# Patient Record
Sex: Female | Born: 1955 | Race: Black or African American | Hispanic: No | Marital: Single | Smoking: Never smoker
Health system: Southern US, Community
[De-identification: ages and names within clinical notes are randomized; demographics above are authoritative.]

## PROBLEM LIST (undated history)

## (undated) DIAGNOSIS — F32A Depression, unspecified: Secondary | ICD-10-CM

## (undated) DIAGNOSIS — F329 Major depressive disorder, single episode, unspecified: Secondary | ICD-10-CM

## (undated) DIAGNOSIS — G4730 Sleep apnea, unspecified: Secondary | ICD-10-CM

## (undated) DIAGNOSIS — E785 Hyperlipidemia, unspecified: Secondary | ICD-10-CM

## (undated) DIAGNOSIS — B20 Human immunodeficiency virus [HIV] disease: Secondary | ICD-10-CM

## (undated) DIAGNOSIS — A6000 Herpesviral infection of urogenital system, unspecified: Secondary | ICD-10-CM

## (undated) DIAGNOSIS — J309 Allergic rhinitis, unspecified: Secondary | ICD-10-CM

## (undated) DIAGNOSIS — N3090 Cystitis, unspecified without hematuria: Secondary | ICD-10-CM

## (undated) DIAGNOSIS — Z21 Asymptomatic human immunodeficiency virus [HIV] infection status: Secondary | ICD-10-CM

## (undated) HISTORY — PX: CARDIAC CATHETERIZATION: SHX172

---

## 2005-01-04 ENCOUNTER — Encounter (INDEPENDENT_AMBULATORY_CARE_PROVIDER_SITE_OTHER): Payer: Self-pay | Admitting: *Deleted

## 2005-04-06 ENCOUNTER — Ambulatory Visit: Payer: Self-pay | Admitting: Infectious Diseases

## 2005-04-06 ENCOUNTER — Encounter: Admission: RE | Admit: 2005-04-06 | Discharge: 2005-04-06 | Payer: Self-pay | Admitting: Infectious Diseases

## 2005-04-06 ENCOUNTER — Encounter (INDEPENDENT_AMBULATORY_CARE_PROVIDER_SITE_OTHER): Payer: Self-pay | Admitting: *Deleted

## 2005-04-26 ENCOUNTER — Ambulatory Visit: Payer: Self-pay | Admitting: Infectious Diseases

## 2005-08-30 ENCOUNTER — Ambulatory Visit: Payer: Self-pay | Admitting: Infectious Diseases

## 2005-08-30 ENCOUNTER — Encounter: Admission: RE | Admit: 2005-08-30 | Discharge: 2005-08-30 | Payer: Self-pay | Admitting: Infectious Diseases

## 2005-08-30 ENCOUNTER — Encounter (INDEPENDENT_AMBULATORY_CARE_PROVIDER_SITE_OTHER): Payer: Self-pay | Admitting: *Deleted

## 2005-08-30 LAB — CONVERTED CEMR LAB: CD4 Count: 250 microliters

## 2005-09-20 ENCOUNTER — Ambulatory Visit: Payer: Self-pay | Admitting: Infectious Diseases

## 2005-11-06 ENCOUNTER — Emergency Department (HOSPITAL_COMMUNITY): Admission: EM | Admit: 2005-11-06 | Discharge: 2005-11-06 | Payer: Self-pay | Admitting: Emergency Medicine

## 2006-01-13 ENCOUNTER — Encounter: Admission: RE | Admit: 2006-01-13 | Discharge: 2006-01-13 | Payer: Self-pay | Admitting: Infectious Diseases

## 2006-01-13 ENCOUNTER — Ambulatory Visit: Payer: Self-pay | Admitting: Infectious Diseases

## 2006-01-13 ENCOUNTER — Encounter (INDEPENDENT_AMBULATORY_CARE_PROVIDER_SITE_OTHER): Payer: Self-pay | Admitting: *Deleted

## 2006-01-13 ENCOUNTER — Emergency Department (HOSPITAL_COMMUNITY): Admission: EM | Admit: 2006-01-13 | Discharge: 2006-01-13 | Payer: Self-pay | Admitting: Emergency Medicine

## 2006-01-13 LAB — CONVERTED CEMR LAB
AST: 16 units/L (ref 0–37)
Alkaline Phosphatase: 97 units/L (ref 39–117)
BUN: 8 mg/dL (ref 6–23)
Basophils Relative: 1 % (ref 0–1)
Creatinine, Ser: 0.83 mg/dL (ref 0.40–1.20)
Eosinophils Relative: 3 % (ref 0–4)
HCT: 37.9 % (ref 34.4–43.3)
Lymphs Abs: 1.7 10*3/uL (ref 0.8–3.1)
MCV: 90.9 fL (ref 78.8–100.0)
Monocytes Relative: 8 % (ref 3–11)
Platelets: 277 10*3/uL (ref 152–374)
RBC: 4.17 M/uL (ref 3.79–4.96)
WBC: 4 10*3/uL (ref 3.7–10.0)

## 2006-01-14 LAB — CONVERTED CEMR LAB: Pap Smear: NORMAL

## 2006-03-15 ENCOUNTER — Ambulatory Visit: Payer: Self-pay | Admitting: Internal Medicine

## 2006-03-22 DIAGNOSIS — B20 Human immunodeficiency virus [HIV] disease: Secondary | ICD-10-CM | POA: Insufficient documentation

## 2006-03-28 ENCOUNTER — Encounter (INDEPENDENT_AMBULATORY_CARE_PROVIDER_SITE_OTHER): Payer: Self-pay | Admitting: *Deleted

## 2006-03-28 LAB — CONVERTED CEMR LAB

## 2006-03-30 ENCOUNTER — Encounter (INDEPENDENT_AMBULATORY_CARE_PROVIDER_SITE_OTHER): Payer: Self-pay | Admitting: Infectious Diseases

## 2007-01-31 ENCOUNTER — Encounter (INDEPENDENT_AMBULATORY_CARE_PROVIDER_SITE_OTHER): Payer: Self-pay | Admitting: *Deleted

## 2007-03-23 ENCOUNTER — Telehealth (INDEPENDENT_AMBULATORY_CARE_PROVIDER_SITE_OTHER): Payer: Self-pay | Admitting: *Deleted

## 2007-09-27 ENCOUNTER — Encounter (INDEPENDENT_AMBULATORY_CARE_PROVIDER_SITE_OTHER): Payer: Self-pay | Admitting: Infectious Diseases

## 2007-12-15 ENCOUNTER — Encounter (INDEPENDENT_AMBULATORY_CARE_PROVIDER_SITE_OTHER): Payer: Self-pay | Admitting: Infectious Diseases

## 2015-01-31 ENCOUNTER — Encounter (HOSPITAL_COMMUNITY): Payer: Self-pay

## 2015-01-31 ENCOUNTER — Emergency Department (HOSPITAL_COMMUNITY)
Admission: EM | Admit: 2015-01-31 | Discharge: 2015-01-31 | Disposition: A | Discharge status: Home / Self Care | Payer: No Typology Code available for payment source | Attending: Emergency Medicine | Admitting: Emergency Medicine

## 2015-01-31 ENCOUNTER — Emergency Department (HOSPITAL_COMMUNITY): Payer: No Typology Code available for payment source

## 2015-01-31 DIAGNOSIS — Y998 Other external cause status: Secondary | ICD-10-CM | POA: Insufficient documentation

## 2015-01-31 DIAGNOSIS — S161XXA Strain of muscle, fascia and tendon at neck level, initial encounter: Secondary | ICD-10-CM | POA: Insufficient documentation

## 2015-01-31 DIAGNOSIS — Y9389 Activity, other specified: Secondary | ICD-10-CM | POA: Insufficient documentation

## 2015-01-31 DIAGNOSIS — S199XXA Unspecified injury of neck, initial encounter: Secondary | ICD-10-CM | POA: Diagnosis present

## 2015-01-31 DIAGNOSIS — S20211A Contusion of right front wall of thorax, initial encounter: Secondary | ICD-10-CM | POA: Diagnosis not present

## 2015-01-31 DIAGNOSIS — S0990XA Unspecified injury of head, initial encounter: Secondary | ICD-10-CM | POA: Diagnosis not present

## 2015-01-31 DIAGNOSIS — S301XXA Contusion of abdominal wall, initial encounter: Secondary | ICD-10-CM | POA: Insufficient documentation

## 2015-01-31 DIAGNOSIS — Y92410 Unspecified street and highway as the place of occurrence of the external cause: Secondary | ICD-10-CM | POA: Diagnosis not present

## 2015-01-31 DIAGNOSIS — B20 Human immunodeficiency virus [HIV] disease: Secondary | ICD-10-CM | POA: Diagnosis not present

## 2015-01-31 HISTORY — DX: Asymptomatic human immunodeficiency virus (hiv) infection status: Z21

## 2015-01-31 HISTORY — DX: Human immunodeficiency virus (HIV) disease: B20

## 2015-01-31 LAB — I-STAT CHEM 8, ED
BUN: 17 mg/dL (ref 6–20)
CHLORIDE: 104 mmol/L (ref 101–111)
CREATININE: 0.8 mg/dL (ref 0.44–1.00)
Calcium, Ion: 1.2 mmol/L (ref 1.12–1.23)
Glucose, Bld: 105 mg/dL — ABNORMAL HIGH (ref 65–99)
HEMATOCRIT: 43 % (ref 36.0–46.0)
Hemoglobin: 14.6 g/dL (ref 12.0–15.0)
Potassium: 3.8 mmol/L (ref 3.5–5.1)
SODIUM: 144 mmol/L (ref 135–145)
TCO2: 27 mmol/L (ref 0–100)

## 2015-01-31 MED ORDER — SODIUM CHLORIDE 0.9 % IV BOLUS (SEPSIS)
1000.0000 mL | Freq: Once | INTRAVENOUS | Status: AC
  Administered 2015-01-31: 1000 mL via INTRAVENOUS

## 2015-01-31 MED ORDER — HYDROCODONE-ACETAMINOPHEN 5-325 MG PO TABS: 1.0000 | ORAL_TABLET | Freq: Four times a day (QID) | ORAL | Status: DC | PRN

## 2015-01-31 NOTE — ED Notes (Signed)
Clarified with Dr. Judd Lienelo, no level 2 activation at this time, radiology to be ordered.
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
## 2015-01-31 NOTE — ED Notes (Signed)
Collar removed per Dr. Judd Lienelo.

## 2015-01-31 NOTE — ED Notes (Signed)
Dr. Delo at the bedside 

## 2015-01-31 NOTE — ED Notes (Signed)
Notified MD Delo that pt ambulated well.
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
## 2015-01-31 NOTE — ED Notes (Signed)
MD Delo notified of pt incontinence and bilateral numbness in legs.

## 2017-12-05 ENCOUNTER — Encounter: Payer: Self-pay | Admitting: *Deleted

## 2017-12-06 ENCOUNTER — Other Ambulatory Visit: Payer: Self-pay

## 2017-12-06 ENCOUNTER — Ambulatory Visit: Payer: BLUE CROSS/BLUE SHIELD | Admitting: Certified Registered Nurse Anesthetist

## 2017-12-06 ENCOUNTER — Ambulatory Visit
Admission: RE | Admit: 2017-12-06 | Discharge: 2017-12-06 | Disposition: A | Discharge status: Home / Self Care | Payer: BLUE CROSS/BLUE SHIELD | Source: Ambulatory Visit | Attending: Internal Medicine | Admitting: Internal Medicine

## 2017-12-06 ENCOUNTER — Encounter: Payer: Self-pay | Admitting: Certified Registered Nurse Anesthetist

## 2017-12-06 ENCOUNTER — Encounter
Admission: RE | Disposition: A | Discharge status: Home / Self Care | Payer: Self-pay | Source: Ambulatory Visit | Attending: Internal Medicine

## 2017-12-06 DIAGNOSIS — G4730 Sleep apnea, unspecified: Secondary | ICD-10-CM | POA: Insufficient documentation

## 2017-12-06 DIAGNOSIS — B20 Human immunodeficiency virus [HIV] disease: Secondary | ICD-10-CM | POA: Diagnosis not present

## 2017-12-06 DIAGNOSIS — Z1211 Encounter for screening for malignant neoplasm of colon: Secondary | ICD-10-CM | POA: Insufficient documentation

## 2017-12-06 DIAGNOSIS — K5730 Diverticulosis of large intestine without perforation or abscess without bleeding: Secondary | ICD-10-CM | POA: Diagnosis not present

## 2017-12-06 DIAGNOSIS — K640 First degree hemorrhoids: Secondary | ICD-10-CM | POA: Diagnosis not present

## 2017-12-06 DIAGNOSIS — F329 Major depressive disorder, single episode, unspecified: Secondary | ICD-10-CM | POA: Insufficient documentation

## 2017-12-06 DIAGNOSIS — Z800 Family history of malignant neoplasm of digestive organs: Secondary | ICD-10-CM | POA: Diagnosis not present

## 2017-12-06 HISTORY — DX: Cystitis, unspecified without hematuria: N30.90

## 2017-12-06 HISTORY — DX: Major depressive disorder, single episode, unspecified: F32.9

## 2017-12-06 HISTORY — DX: Depression, unspecified: F32.A

## 2017-12-06 HISTORY — DX: Allergic rhinitis, unspecified: J30.9

## 2017-12-06 HISTORY — PX: COLONOSCOPY WITH PROPOFOL: SHX5780

## 2017-12-06 HISTORY — DX: Sleep apnea, unspecified: G47.30

## 2017-12-06 HISTORY — DX: Herpesviral infection of urogenital system, unspecified: A60.00

## 2017-12-06 SURGERY — COLONOSCOPY WITH PROPOFOL
Anesthesia: General

## 2017-12-06 MED ORDER — MIDAZOLAM HCL 2 MG/2ML IJ SOLN
INTRAMUSCULAR | Status: AC
  Filled 2017-12-06: qty 2

## 2017-12-06 MED ORDER — LIDOCAINE HCL (CARDIAC) PF 100 MG/5ML IV SOSY
PREFILLED_SYRINGE | INTRAVENOUS | Status: DC | PRN
  Administered 2017-12-06: 60 mg via INTRAVENOUS

## 2017-12-06 MED ORDER — SODIUM CHLORIDE 0.9 % IV SOLN
INTRAVENOUS | Status: DC
  Administered 2017-12-06: 14:00:00 via INTRAVENOUS

## 2017-12-06 MED ORDER — PROPOFOL 500 MG/50ML IV EMUL
INTRAVENOUS | Status: DC | PRN
  Administered 2017-12-06: 100 ug/kg/min via INTRAVENOUS

## 2017-12-06 MED ORDER — PROPOFOL 10 MG/ML IV BOLUS
INTRAVENOUS | Status: DC | PRN
  Administered 2017-12-06: 80 mg via INTRAVENOUS

## 2017-12-06 MED ORDER — FENTANYL CITRATE (PF) 100 MCG/2ML IJ SOLN
INTRAMUSCULAR | Status: AC
  Filled 2017-12-06: qty 2

## 2017-12-06 MED ORDER — LIDOCAINE HCL (PF) 1 % IJ SOLN
INTRAMUSCULAR | Status: AC
  Administered 2017-12-06: 2 mL
  Filled 2017-12-06: qty 2

## 2017-12-06 MED ORDER — PROPOFOL 500 MG/50ML IV EMUL
INTRAVENOUS | Status: AC
  Filled 2017-12-06: qty 50

## 2017-12-06 MED ORDER — FENTANYL CITRATE (PF) 100 MCG/2ML IJ SOLN
INTRAMUSCULAR | Status: DC | PRN
  Administered 2017-12-06: 25 ug via INTRAVENOUS

## 2017-12-06 MED ORDER — LIDOCAINE HCL (PF) 2 % IJ SOLN
INTRAMUSCULAR | Status: AC
  Filled 2017-12-06: qty 10

## 2017-12-06 MED ORDER — MIDAZOLAM HCL 5 MG/5ML IJ SOLN
INTRAMUSCULAR | Status: DC | PRN
  Administered 2017-12-06: 2 mg via INTRAVENOUS

## 2017-12-06 NOTE — Anesthesia Preprocedure Evaluation (Addendum)
Anesthesia Evaluation  Patient identified by MRN, date of birth, ID band Patient awake    Reviewed: Allergy & Precautions, H&P , NPO status , Patient's Chart, lab work & pertinent test results  Airway Mallampati: III  TM Distance: >3 FB Neck ROM: full    Dental  (+) Teeth Intact   Pulmonary sleep apnea ,    breath sounds clear to auscultation       Cardiovascular negative cardio ROS   Rhythm:regular Rate:Normal     Neuro/Psych PSYCHIATRIC DISORDERS Depression negative neurological ROS     GI/Hepatic negative GI ROS, Neg liver ROS,   Endo/Other  negative endocrine ROS  Renal/GU negative Renal ROS  negative genitourinary   Musculoskeletal   Abdominal   Peds  Hematology negative hematology ROS (+)   Anesthesia Other Findings HIV positive status  Reproductive/Obstetrics negative OB ROS                            Anesthesia Physical Anesthesia Plan  ASA: II  Anesthesia Plan: General   Post-op Pain Management:    Induction:   PONV Risk Score and Plan: Propofol infusion and TIVA  Airway Management Planned: Natural Airway and Nasal Cannula  Additional Equipment:   Intra-op Plan:   Post-operative Plan:   Informed Consent: I have reviewed the patients History and Physical, chart, labs and discussed the procedure including the risks, benefits and alternatives for the proposed anesthesia with the patient or authorized representative who has indicated his/her understanding and acceptance.   Dental Advisory Given  Plan Discussed with: Anesthesiologist, CRNA and Surgeon  Anesthesia Plan Comments:         Anesthesia Quick Evaluation

## 2017-12-06 NOTE — Transfer of Care (Signed)
Immediate Anesthesia Transfer of Care Note  Patient: Gwendolyn Gray  Procedure(s) Performed: COLONOSCOPY WITH PROPOFOL (N/A )  Patient Location: Endoscopy Unit  Anesthesia Type:General  Level of Consciousness: drowsy and patient cooperative  Airway & Oxygen Therapy: Patient Spontanous Breathing  Post-op Assessment: Report given to RN and Post -op Vital signs reviewed and stable  Post vital signs: Reviewed and stable  Last Vitals:  Vitals Value Taken Time  BP 106/67 12/06/2017  3:18 PM  Temp    Pulse 83 12/06/2017  3:21 PM  Resp 16 12/06/2017  3:21 PM  SpO2 97 % 12/06/2017  3:21 PM  Vitals shown include unvalidated device data.  Last Pain:  Vitals:   12/06/17 1500  TempSrc: Tympanic  PainSc: Asleep         Complications: No apparent anesthesia complications

## 2017-12-06 NOTE — Op Note (Signed)
[facility] Patient Name: Gwendolyn Gray Procedure Date: 12/06/2017 2:55 PM MRN: [id number] Account #: [id number] Date of Birth: 1955/09/02 Admit Type: Outpatient Age: 62 Room: [facility] ENDO ROOM 2 Gender: Female Note Status: Finalized Procedure:            Colonoscopy Indications:          Screening in patient at increased risk: Family history                        of 1st-degree relative with colorectal cancer Providers:            Boykin Nearing. Norma Fredrickson MD, MD Referring MD:         Lynnda Shields. Artis Flock, PA (Referring MD) Medicines:            Propofol per Anesthesia Complications:        No immediate complications. Procedure:            Pre-Anesthesia Assessment:                       - The risks and benefits of the procedure and the                        sedation options and risks were discussed with the                        patient. All questions were answered and informed                        consent was obtained.                       - Patient identification and proposed procedure were                        verified prior to the procedure by the nurse. The                        procedure was verified in the procedure room.                       - ASA Grade Assessment: III - A patient with severe                        systemic disease.                       - After reviewing the risks and benefits, the patient                        was deemed in satisfactory condition to undergo the                        procedure.                       After obtaining informed consent, the colonoscope was                        passed under direct vision. Throughout the procedure,  the patient's blood pressure, pulse, and oxygen                        saturations were monitored continuously. The                        Colonoscope was introduced through the anus and                        advanced to the the cecum, identified by appendiceal                        orifice and ileocecal valve. The colonoscopy was                        performed without difficulty. The patient tolerated the                        procedure well. The quality of the bowel preparation                        was excellent. The ileocecal valve, appendiceal                        orifice, and rectum were photographed. Findings:      The perianal and digital rectal examinations were normal. Pertinent       negatives include normal sphincter tone and no palpable rectal lesions.      A few small-mouthed diverticula were found in the sigmoid colon.      Internal hemorrhoids were found during retroflexion. The hemorrhoids       were Grade I (internal hemorrhoids that do not prolapse).      The exam was otherwise without abnormality. Impression:           - Diverticulosis in the sigmoid colon.                       - Internal hemorrhoids.                       - The examination was otherwise normal.                       - No specimens collected. Recommendation:       - Patient has a contact number available for                        emergencies. The signs and symptoms of potential                        delayed complications were discussed with the patient.                        Return to normal activities tomorrow. Written discharge                        instructions were provided to the patient.                       - Resume previous diet.                       -  Continue present medications.                       - Repeat colonoscopy in 5 years for screening purposes.                       - Return to GI office PRN.                       - The findings and recommendations were discussed with                        the patient and their family. Procedure Code(s):    --- Professional ---                       R6045, Colorectal cancer screening; colonoscopy on                        individual at high risk Diagnosis Code(s):    --- Professional ---                        K57.30, Diverticulosis of large intestine without                        perforation or abscess without bleeding                       K64.0, First degree hemorrhoids                       Z80.0, Family history of malignant neoplasm of                        digestive organs CPT copyright 2018 American Medical Association. All rights reserved. The codes documented in this report are preliminary and upon coder review may  be revised to meet current compliance requirements. Stanton Kidney MD, MD 12/06/2017 3:16:19 PM This report has been signed electronically. Number of Addenda: 0 Note Initiated On: 12/06/2017 2:55 PM Scope Withdrawal Time: 0 hours 5 minutes 26 seconds  Total Procedure Duration: 0 hours 9 minutes 56 seconds       [facility]

## 2017-12-06 NOTE — Anesthesia Post-op Follow-up Note (Signed)
Anesthesia QCDR form completed.        

## 2017-12-06 NOTE — Interval H&P Note (Signed)
History and Physical Interval Note:  12/06/2017 11:41 AM  Gwendolyn Gray  has presented today for surgery, with the diagnosis of SCREENING FAM HX COLON CANCER  The various methods of treatment have been discussed with the patient and family. After consideration of risks, benefits and other options for treatment, the patient has consented to  Procedure(s): COLONOSCOPY WITH PROPOFOL (N/A) as a surgical intervention .  The patient's history has been reviewed, patient examined, no change in status, stable for surgery.  I have reviewed the patient's chart and labs.  Questions were answered to the patient's satisfaction.     [place], [place]

## 2017-12-06 NOTE — Interval H&P Note (Signed)
History and Physical Interval Note:  12/06/2017 2:52 PM  Gwendolyn Gray  has presented today for surgery, with the diagnosis of SCREENING FAM HX COLON CANCER  The various methods of treatment have been discussed with the patient and family. After consideration of risks, benefits and other options for treatment, the patient has consented to  Procedure(s): COLONOSCOPY WITH PROPOFOL (N/A) as a surgical intervention .  The patient's history has been reviewed, patient examined, no change in status, stable for surgery.  I have reviewed the patient's chart and labs.  Questions were answered to the patient's satisfaction.     [place], [place]

## 2017-12-06 NOTE — H&P (Signed)
Outpatient short stay form Pre-procedure 12/06/2017 11:39 AM Teodoro K. Norma Fredrickson, M.D.  Primary Physician: Yong Channel, M.D.  Reason for visit: Family history of colon cancer - Father  History of present illness:  62 y/o female presents for increased risk colon cancer screening due to her father having colon cancer. Patient denies change in bowel habits, rectal bleeding, weight loss or abdominal pain.    No current facility-administered medications for this encounter.   Medications Prior to Admission  Medication Sig Dispense Refill Last Dose  . acyclovir ointment (ZOVIRAX) 5 % Apply 1 application topically every 3 (three) hours.     . clobetasol cream (TEMOVATE) 0.05 % Apply 1 application topically 2 (two) times daily.     Marland Kitchen estradiol (ESTRACE) 0.5 MG tablet Take 0.5 mg by mouth daily.     . medroxyPROGESTERone (PROVERA) 2.5 MG tablet Take 2.5 mg by mouth daily.     . Multiple Vitamin (MULTIVITAMIN) tablet Take 1 tablet by mouth daily.     . valACYclovir (VALTREX) 500 MG tablet Take 500 mg by mouth 2 (two) times daily.     . vitamin B-12 (CYANOCOBALAMIN) 500 MCG tablet Take 500 mcg by mouth daily.     . Vitamin D, Ergocalciferol, (DRISDOL) 50000 units CAPS capsule Take 50,000 Units by mouth every 7 (seven) days.     . ATRIPLA 600-200-300 MG tablet Take 1 tablet by mouth daily.  11 01/30/2015 at Unknown time  . HYDROcodone-acetaminophen (NORCO) 5-325 MG tablet Take 1-2 tablets by mouth every 6 (six) hours as needed. 15 tablet 0      No Known Allergies   Past Medical History:  Diagnosis Date  . Allergic rhinitis   . Cystitis   . Depression   . Genital herpes   . HIV (human immunodeficiency virus infection) (HCC)   . HIV (human immunodeficiency virus infection) (HCC)     Review of systems:  Otherwise negative.    Physical Exam  Gen: Alert, oriented. Appears stated age.  HEENT: [place]/AT. PERRLA. Lungs: CTA, no wheezes. CV: RR nl S1, S2. Abd: soft, benign, no masses.  BS+ Ext: No edema. Pulses 2+    Planned procedures: Proceed with colonoscopy. The patient understands the nature of the planned procedure, indications, risks, alternatives and potential complications including but not limited to bleeding, infection, perforation, damage to internal organs and possible oversedation/side effects from anesthesia. The patient agrees and gives consent to proceed.  Please refer to procedure notes for findings, recommendations and patient disposition/instructions.     Teodoro K. Norma Fredrickson, M.D. Gastroenterology 12/06/2017  11:39 AM

## 2017-12-06 NOTE — Anesthesia Postprocedure Evaluation (Signed)
Anesthesia Post Note  Patient: Gwendolyn Gray  Procedure(s) Performed: COLONOSCOPY WITH PROPOFOL (N/A )  Patient location during evaluation: Endoscopy Anesthesia Type: General Level of consciousness: awake and alert Pain management: pain level controlled Vital Signs Assessment: post-procedure vital signs reviewed and stable Respiratory status: spontaneous breathing, nonlabored ventilation, respiratory function stable and patient connected to nasal cannula oxygen Cardiovascular status: blood pressure returned to baseline and stable Postop Assessment: no apparent nausea or vomiting Anesthetic complications: no     Last Vitals:  Vitals:   12/06/17 1527 12/06/17 1537  BP: 122/82 125/76  Pulse: 76 64  Resp: 14 14  Temp:    SpO2: 100% 100%    Last Pain:  Vitals:   12/06/17 1537  TempSrc:   PainSc: 0-No pain                 Meenakshi Sazama S

## 2017-12-08 ENCOUNTER — Encounter: Payer: Self-pay | Admitting: Internal Medicine

## 2018-03-16 ENCOUNTER — Encounter (HOSPITAL_COMMUNITY): Payer: Self-pay | Admitting: Emergency Medicine

## 2018-03-16 ENCOUNTER — Emergency Department (HOSPITAL_COMMUNITY)
Admission: EM | Admit: 2018-03-16 | Discharge: 2018-03-16 | Disposition: A | Discharge status: Home / Self Care | Payer: BLUE CROSS/BLUE SHIELD | Attending: Emergency Medicine | Admitting: Emergency Medicine

## 2018-03-16 ENCOUNTER — Other Ambulatory Visit: Payer: Self-pay

## 2018-03-16 ENCOUNTER — Emergency Department (HOSPITAL_COMMUNITY): Payer: BLUE CROSS/BLUE SHIELD

## 2018-03-16 DIAGNOSIS — M545 Low back pain, unspecified: Secondary | ICD-10-CM

## 2018-03-16 DIAGNOSIS — Z79899 Other long term (current) drug therapy: Secondary | ICD-10-CM | POA: Diagnosis not present

## 2018-03-16 DIAGNOSIS — M546 Pain in thoracic spine: Secondary | ICD-10-CM

## 2018-03-16 DIAGNOSIS — B20 Human immunodeficiency virus [HIV] disease: Secondary | ICD-10-CM | POA: Insufficient documentation

## 2018-03-16 MED ORDER — METHOCARBAMOL 500 MG PO TABS: 500.0000 mg | ORAL_TABLET | Freq: Two times a day (BID) | ORAL | 0 refills | Status: AC

## 2018-03-16 MED ORDER — ACETAMINOPHEN 500 MG PO TABS
1000.0000 mg | ORAL_TABLET | Freq: Once | ORAL | Status: AC
  Administered 2018-03-16: 1000 mg via ORAL
  Filled 2018-03-16: qty 2

## 2018-03-16 MED ORDER — METHOCARBAMOL 500 MG PO TABS
500.0000 mg | ORAL_TABLET | Freq: Once | ORAL | Status: AC
  Administered 2018-03-16: 500 mg via ORAL
  Filled 2018-03-16: qty 1

## 2018-03-16 NOTE — Discharge Instructions (Addendum)
The pain you are experiencing is likely due to muscle strain, you may take tylenol and Robaxin as needed for pain management. You may also use ice and heat, and over-the-counter remedies such as Biofreeze gel or salon pas lidocaine patches. The muscle soreness should improve over the next week. Follow up with your family doctor in the next week for a recheck if you are still having symptoms. Return to ED if pain is worsening, you develop weakness or numbness of extremities, or new or concerning symptoms develop.   Your x-ray showed some atherosclerosis of your aorta today, please follow-up with your primary care doctor regarding this as they may wish to do an ultrasound of your aorta to assess its size.
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
## 2018-03-16 NOTE — ED Notes (Signed)
Patient verbalizes understanding of discharge instructions. Opportunity for questioning and answers were provided. Armband removed by staff, pt discharged from ED.  

## 2018-03-16 NOTE — ED Notes (Signed)
Patient transported to X-ray 

## 2018-07-28 ENCOUNTER — Other Ambulatory Visit: Payer: Self-pay | Admitting: Critical Care Medicine

## 2018-08-01 LAB — NOVEL CORONAVIRUS, NAA: SARS-CoV-2, NAA: NOT DETECTED
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
## 2022-03-10 ENCOUNTER — Encounter (HOSPITAL_COMMUNITY): Payer: Self-pay

## 2022-03-10 ENCOUNTER — Emergency Department (HOSPITAL_COMMUNITY)
Admission: EM | Admit: 2022-03-10 | Discharge: 2022-03-10 | Disposition: A | Discharge status: Home / Self Care | Payer: BLUE CROSS/BLUE SHIELD | Attending: Emergency Medicine | Admitting: Emergency Medicine

## 2022-03-10 ENCOUNTER — Other Ambulatory Visit: Payer: Self-pay

## 2022-03-10 DIAGNOSIS — Z21 Asymptomatic human immunodeficiency virus [HIV] infection status: Secondary | ICD-10-CM | POA: Insufficient documentation

## 2022-03-10 DIAGNOSIS — H6121 Impacted cerumen, right ear: Secondary | ICD-10-CM | POA: Insufficient documentation

## 2022-03-10 DIAGNOSIS — I10 Essential (primary) hypertension: Secondary | ICD-10-CM

## 2022-03-10 DIAGNOSIS — R42 Dizziness and giddiness: Secondary | ICD-10-CM

## 2022-03-10 DIAGNOSIS — Z20822 Contact with and (suspected) exposure to covid-19: Secondary | ICD-10-CM | POA: Insufficient documentation

## 2022-03-10 LAB — COMPREHENSIVE METABOLIC PANEL
ALT: 14 U/L (ref 0–44)
AST: 18 U/L (ref 15–41)
Albumin: 4 g/dL (ref 3.5–5.0)
Alkaline Phosphatase: 70 U/L (ref 38–126)
Anion gap: 11 (ref 5–15)
BUN: 10 mg/dL (ref 8–23)
CO2: 24 mmol/L (ref 22–32)
Calcium: 9.4 mg/dL (ref 8.9–10.3)
Chloride: 105 mmol/L (ref 98–111)
Creatinine, Ser: 0.94 mg/dL (ref 0.44–1.00)
GFR, Estimated: >60 mL/min (ref >60)
Glucose, Bld: 110 mg/dL — ABNORMAL HIGH (ref 70–99)
Potassium: 3.7 mmol/L (ref 3.5–5.1)
Sodium: 140 mmol/L (ref 135–145)
Total Bilirubin: 0.4 mg/dL (ref 0.3–1.2)
Total Protein: 7.2 g/dL (ref 6.5–8.1)

## 2022-03-10 LAB — CBC WITH DIFFERENTIAL/PLATELET
Abs Immature Granulocytes: 0.01 10*3/uL (ref 0.00–0.07)
Basophils Absolute: 0 10*3/uL (ref 0.0–0.1)
Basophils Relative: 1 %
Eosinophils Absolute: 0.1 10*3/uL (ref 0.0–0.5)
Eosinophils Relative: 2 %
HCT: 38.5 % (ref 36.0–46.0)
Hemoglobin: 12.7 g/dL (ref 12.0–15.0)
Immature Granulocytes: 0 %
Lymphocytes Relative: 29 %
Lymphs Abs: 1.1 10*3/uL (ref 0.7–4.0)
MCH: 31.2 pg (ref 26.0–34.0)
MCHC: 33 g/dL (ref 30.0–36.0)
MCV: 94.6 fL (ref 80.0–100.0)
Monocytes Absolute: 0.4 10*3/uL (ref 0.1–1.0)
Monocytes Relative: 10 %
Neutro Abs: 2.2 10*3/uL (ref 1.7–7.7)
Neutrophils Relative %: 58 %
Platelets: 253 10*3/uL (ref 150–400)
RBC: 4.07 MIL/uL (ref 3.87–5.11)
RDW: 13.2 % (ref 11.5–15.5)
WBC: 3.7 10*3/uL — ABNORMAL LOW (ref 4.0–10.5)
nRBC: 0 % (ref 0.0–0.2)

## 2022-03-10 LAB — RESP PANEL BY RT-PCR (RSV, FLU A&B, COVID)  RVPGX2
Influenza A by PCR: NEGATIVE
Influenza B by PCR: NEGATIVE
Resp Syncytial Virus by PCR: NEGATIVE
SARS Coronavirus 2 by RT PCR: NEGATIVE

## 2022-03-10 MED ORDER — MECLIZINE HCL 25 MG PO TABS
25.0000 mg | ORAL_TABLET | Freq: Once | ORAL | Status: AC
  Administered 2022-03-10: 25 mg via ORAL
  Filled 2022-03-10: qty 1

## 2022-03-10 MED ORDER — ACETAMINOPHEN 325 MG PO TABS
650.0000 mg | ORAL_TABLET | Freq: Four times a day (QID) | ORAL | Status: DC | PRN
  Administered 2022-03-10: 650 mg via ORAL
  Filled 2022-03-10: qty 2

## 2022-03-10 MED ORDER — MECLIZINE HCL 25 MG PO TABS: 25.0000 mg | ORAL_TABLET | Freq: Three times a day (TID) | ORAL | 0 refills | Status: AC | PRN

## 2022-03-10 MED ORDER — ACETAMINOPHEN 500 MG PO TABS: 500.0000 mg | ORAL_TABLET | Freq: Four times a day (QID) | ORAL | 0 refills | Status: AC | PRN
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
## 2022-03-10 NOTE — ED Notes (Signed)
This RN reviewed discharge instructions with patient. She verbalized understanding and denied any further questions. PT well appearing upon discharge and denies pain. Pt ambulated with stable gait to exit.

## 2022-03-10 NOTE — ED Provider Notes (Signed)
[facility] Provider Note   CSN: [id number] Arrival date & time: 03/10/22  [id number]     History  Chief Complaint  Patient presents with   Dizziness   Shortness of Breath    Gwendolyn Gray is a 67 y.o. female.  The history is provided by the patient and medical records. No language interpreter was used.  Dizziness Quality:  Room spinning Associated symptoms: shortness of breath   Shortness of Breath    67 year old female significant history of HIV, depression, presenting with complaints of dizziness.  Patient reports since yesterday she has been having intermittent episodes of dizziness in which she described as a room spinning sensation.  Today when she was walking her dog and bending down to pick up her purse, she reported worsening dizziness with increased room spinning sensation, felt weak in her leg.  She reported having an associated headache with it as well but attributed to sinus congestion that she has been dealing with for the past week.  States her nose is stuffed up and headache is an ongoing issue for more than a week.  She denies any fever or chills no runny nose sneezing coughing sore throat body aches.  No report of nausea vomiting or diarrhea no dysuria.  Patient also report receiving meclizine and Tylenol in triage a few hours ago also with has helped with her symptoms.  Patient report her HIV is well-controlled with CD4 count in the thousands, and undetectable viral load.  She did report recent change in her HIV medication approximately 2 months ago.  She denies any history of hypertension.  Home Medications Prior to Admission medications   Medication Sig Start Date End Date Taking? Authorizing Provider  acyclovir ointment (ZOVIRAX) 5 % Apply 1 application topically every 3 (three) hours.    [provider]  ATRIPLA 600-200-300 MG tablet Take 1 tablet by mouth daily. 01/04/15   [provider]  clobetasol cream  (TEMOVATE) [id number] % Apply 1 application topically 2 (two) times daily.    [provider]  estradiol (ESTRACE) 0.5 MG tablet Take 0.5 mg by mouth daily.    [provider]  HYDROcodone-acetaminophen (NORCO) 5-325 MG tablet Take 1-2 tablets by mouth every 6 (six) hours as needed. Patient not taking: Reported on 12/06/2017 01/31/15   Veryl Speak, MD  medroxyPROGESTERone (PROVERA) 2.5 MG tablet Take 2.5 mg by mouth daily.    [provider]  methocarbamol (ROBAXIN) 500 MG tablet Take 1 tablet (500 mg total) by mouth 2 (two) times daily. 03/16/18   Jacqlyn Larsen, PA-C  Multiple Vitamin (MULTIVITAMIN) tablet Take 1 tablet by mouth daily.    [provider]  valACYclovir (VALTREX) 500 MG tablet Take 500 mg by mouth 2 (two) times daily.    [provider]  vitamin B-12 (CYANOCOBALAMIN) 500 MCG tablet Take 500 mcg by mouth daily.    [provider]  Vitamin D, Ergocalciferol, (DRISDOL) 50000 units CAPS capsule Take 50,000 Units by mouth every 7 (seven) days.    [provider]      Allergies    Gabapentin    Review of Systems   Review of Systems  Respiratory:  Positive for shortness of breath.   Neurological:  Positive for dizziness.  All other systems reviewed and are negative.   Physical Exam Updated Vital Signs BP (!) 192/81 (BP Location: Right Arm)   Pulse 60   Temp 98.5 F (36.9 C)   Resp 18  Ht 5' 2"$  (1.575 m)   Wt 74.8 kg   SpO2 100%   BMI 30.18 kg/m  Physical Exam Vitals and nursing note reviewed.  Constitutional:      General: She is not in acute distress.    Appearance: She is well-developed.  HENT:     Head: Atraumatic.     Right Ear: Hearing normal. There is impacted cerumen.     Left Ear: Hearing and tympanic membrane normal.     Mouth/Throat:     Mouth: Mucous membranes are moist.  Eyes:     Extraocular Movements: Extraocular movements intact.     Conjunctiva/sclera: Conjunctivae normal.     Pupils:  Pupils are equal, round, and reactive to light.  Cardiovascular:     Rate and Rhythm: Normal rate and regular rhythm.     Pulses: Normal pulses.     Heart sounds: Normal heart sounds.  Pulmonary:     Effort: Pulmonary effort is normal.  Abdominal:     Palpations: Abdomen is soft.  Musculoskeletal:     Cervical back: Normal range of motion and neck supple.  Skin:    Findings: No rash.  Neurological:     Mental Status: She is alert and oriented to person, place, and time.     GCS: GCS eye subscore is 4. GCS verbal subscore is 5. GCS motor subscore is 6.     Cranial Nerves: Cranial nerves 2-12 are intact.     Sensory: Sensation is intact.     Motor: Motor function is intact.     Coordination: Coordination is intact.     Gait: Gait is intact.  Psychiatric:        Mood and Affect: Mood normal.     ED Results / Procedures / Treatments   Labs (all labs ordered are listed, but only abnormal results are displayed) Labs Reviewed  CBC WITH DIFFERENTIAL/PLATELET - Abnormal; Notable for the following components:      Result Value   WBC 3.7 (*)    All other components within normal limits  COMPREHENSIVE METABOLIC PANEL - Abnormal; Notable for the following components:   Glucose, Bld 110 (*)    All other components within normal limits  RESP PANEL BY RT-PCR (RSV, FLU A&B, COVID)  [id number]    EKG None  Date: 03/10/2022  Rate: 55  Rhythm: normal sinus rhythm  QRS Axis: normal  Intervals: normal  ST/T Wave abnormalities: normal  Conduction Disutrbances: none  Narrative Interpretation:   Old EKG Reviewed: No significant changes noted    Radiology No results found.  Procedures Procedures    Medications Ordered in ED Medications  acetaminophen (TYLENOL) tablet 650 mg (650 mg Oral Given 03/10/22 0955)  meclizine (ANTIVERT) tablet 25 mg (25 mg Oral Given 03/10/22 [id number])    ED Course/ Medical Decision Making/ A&P                             Medical Decision Making Amount  and/or Complexity of Data Reviewed ECG/medicine tests: ordered.   BP (!) 176/85   Pulse (!) 53   Temp 98.5 F (36.9 C)   Resp 17   Ht 5' 2"$  (1.575 m)   Wt 74.8 kg   SpO2 99%   BMI 30.18 kg/m   46:63 AM 67 year old female significant history of HIV, depression, presenting with complaints of dizziness.  Patient reports since yesterday she has been having intermittent episodes of dizziness in which she  described as a room spinning sensation.  Today when she was walking her dog and bending down to pick up her purse, she reported worsening dizziness with increased room spinning sensation, felt weak in her leg.  She reported having an associated headache with it as well but attributed to sinus congestion that she has been dealing with for the past week.  States her nose is stuffed up and headache is an ongoing issue for more than a week.  She denies any fever or chills no runny nose sneezing coughing sore throat body aches.  No report of nausea vomiting or diarrhea no dysuria.  Patient also report receiving meclizine and Tylenol in triage a few hours ago also with has helped with her symptoms.  Patient report her HIV is well-controlled with CD4 count in the thousands, and undetectable viral load.  She did report recent change in her HIV medication approximately 2 months ago.  She denies any history of hypertension.  On exam this is a well-appearing female laying in bed appears to be in no acute discomfort.  She does not have any focal neurodeficit.  She has a normal steady gait.  She does endorse increased dizziness with positional change.  She does have cerumen impaction in her right ear canal, left ear canal with normal appearance.  She does not have any appreciable nystagmus.  Strength is equal throughout all 4 extremities.  Heart with normal rate and rhythm, lungs clear to auscultation abdomen soft nontender.  Symptoms suggestive of BPPV and less likely posterior circulation stroke.  Vestibular  neuritis is a potential as patient has been complaining of sinus congestion and she has impacted cerumen.  Headache without any red flags likely sinus headache as it has been ongoing for the past week.  No acute onset of thunderclap headache concerning for subarachnoid hemorrhage, no fever or nuchal rigidity concerning for meningitis, no focal neurodeficit concerns for stroke or space occupying lesion.  Vital sign reviewed and are remarkable for elevated blood pressure of 136/85.  Mild bradycardia with heart rate of 53.  Patient is afebrile, no hypoxia.  Labs obtained independently viewed interpreted by me.  She has reassuring electrolyte panel, normal WBC, normal H&H, she tested negative for COVID, flu, and RSV.  Patient reported improvement of her headache and dizziness with Tylenol and meclizine.  12:08 PM Patient is never been diagnosed with hypertension in the past.  However review EMR is noted for elevated blood pressure as high as 160/77 during her visit on 01/31/2015.  Today her blood pressure was as high as 192/81.  Her elevated blood pressure may be secondary to OTC sinus medication and her recent sickness however I am also concerned of undiagnosed hypertension.  I discussed this with patient and recommend close follow-up with PCP for recheck as patient may benefit from antihypertensive medication.  EKG obtained today and independently interpreted by me are without concerning arrhythmia or ischemic changes.        Final Clinical Impression(s) / ED Diagnoses Final diagnoses:  Dizziness  Uncontrolled hypertension    Rx / DC Orders ED Discharge Orders          Ordered    meclizine (ANTIVERT) 25 MG tablet  3 times daily PRN        03/10/22 1303    acetaminophen (TYLENOL) 500 MG tablet  Every 6 hours PRN        03/10/22 1303              Domenic Moras,  PA-C 03/10/22 Capac, Ankit, MD 03/13/22 [phone]

## 2022-03-10 NOTE — Discharge Instructions (Signed)
You have been evaluated for your symptoms.  Your condition is likely due to benign paroxysmal positional vertigo.  Take meclizine as prescribed.  Apply several drops of hydrogen peroxide in your right ear before showering several times a week to help dissolve earwax.  Take Tylenol as needed for headache.  Return to ER if you have any concern.

## 2022-03-10 NOTE — ED Provider Triage Note (Signed)
Emergency Medicine Provider Triage Evaluation Note  Gwendolyn Gray , a 68 y.o. female  was evaluated in triage.  Pt complains of a runny nose and a headache.  Pt reports feeling weak and dizzy   Review of Systems  Positive: Nasal congestion Negative: fever  Physical Exam  BP (!) 192/81 (BP Location: Right Arm)   Pulse 60   Temp 98.5 F (36.9 C)   Resp 18   Ht 5' 2"$  (1.575 m)   Wt 74.8 kg   SpO2 100%   BMI 30.18 kg/m  Gen:   Awake, no distress   Resp:  Normal effort  MSK:   Moves extremities without difficulty  Other:    Medical Decision Making  Medically screening exam initiated at 9:48 AM.  Appropriate orders placed.  Daveah Herling was informed that the remainder of the evaluation will be completed by another provider, this initial triage assessment does not replace that evaluation, and the importance of remaining in the ED until their evaluation is complete.     Fransico Meadow, [place] 03/10/22 [phone]

## 2022-03-10 NOTE — ED Notes (Signed)
Pt ambulated to bathroom with stable gait.

## 2023-05-17 HISTORY — PX: OTHER SURGICAL HISTORY: SHX169

## 2023-06-01 ENCOUNTER — Telehealth (HOSPITAL_COMMUNITY): Payer: Self-pay

## 2023-06-01 NOTE — Telephone Encounter (Signed)
 Received outside cardiac rehab referral for pt from [facility], called tp to see if she is interested pt stated that she will call us  back because she is having transportation issues at the moment.

## 2023-06-13 ENCOUNTER — Telehealth (HOSPITAL_COMMUNITY): Payer: Self-pay

## 2023-06-13 NOTE — Telephone Encounter (Signed)
 Pt stop by CR and stated she interested in participate. Explained scheduling process and adv pt she would need to complete f/u with Dr. Bernadette Brewster on 6/5 @ 1PM, patient verbalized understanding. Will pass pt ppw to RN for review.

## 2023-06-13 NOTE — Telephone Encounter (Signed)
 Pt insurance is active and benefits verified through [facility]. Co-pay $25.00, DED $0.00/$0.00 met, out of pocket $6,750.00/$3,725.51 met, co-insurance 0%. No pre-authorization required. Passport, 06/13/23 @ 2:50PM, REF#[id number]   How many CR sessions are covered? (36 visits for TCR, 72 visits for ICR)72 Is this a lifetime maximum or an annual maximum? Annual Has the member used any of these services to date? No Is there a time limit (weeks/months) on start of program and/or program completion? No

## 2023-07-08 ENCOUNTER — Telehealth (HOSPITAL_COMMUNITY): Payer: Self-pay

## 2023-07-08 NOTE — Telephone Encounter (Signed)
 Pt called wanting to know what is the hold up for getting her scheduled for cardiac rehab. I advised pt that we are waiting on her cardiologist Dr. Milbert Alice from [facility] to send over a 12 lead EKG tracing. Advised pt that we sent over a request form to receive that information from them and we havent had a response. Pt stated that she will give them a call for them to fax that over. I provided her our fax number.

## 2023-07-13 ENCOUNTER — Telehealth (HOSPITAL_COMMUNITY): Payer: Self-pay

## 2023-07-13 NOTE — Telephone Encounter (Signed)
 Pt called and stated she is interested in CR, adv pt we have recv'ed requested information and is able to schedule now.   Patient will come in for orientation on 07/21/23 @ 10:30AM and will attend the 1:45PM exercise class.   Pensions consultant.

## 2023-07-21 ENCOUNTER — Encounter (HOSPITAL_COMMUNITY)
Admission: RE | Admit: 2023-07-21 | Discharge: 2023-07-21 | Disposition: A | Discharge status: Home / Self Care | Source: Ambulatory Visit | Attending: Cardiology | Admitting: Cardiology

## 2023-07-21 ENCOUNTER — Encounter (HOSPITAL_COMMUNITY): Payer: Self-pay

## 2023-07-21 VITALS — BP 126/82 | HR 81 | Ht 63.0 in | Wt 165.1 lb

## 2023-07-21 DIAGNOSIS — Z9889 Other specified postprocedural states: Secondary | ICD-10-CM | POA: Insufficient documentation

## 2023-07-21 HISTORY — DX: Hyperlipidemia, unspecified: E78.5

## 2023-07-21 NOTE — Progress Notes (Signed)
 Cardiac Rehab Medication Review by a Nurse  Does the patient  feel that his/her medications are working for him/her?  yes  Has the patient been experiencing any side effects to the medications prescribed?  no  Does the patient measure his/her own blood pressure or blood glucose at home?  no   Does the patient have any problems obtaining medications due to transportation or finances?   yes  Understanding of regimen: good Understanding of indications: good Potential of compliance: good   Nurse comments: Gwendolyn Gray is taking her medications as prescribed and has a good understanding of what her medications are for. Gwendolyn Gray has a blood pressure cuff. Gwendolyn Gray does not check her blood pressures on a regular basis. Gwendolyn Gray has financial and transportation challenges.Gwendolyn Gray is followed by [facility] patient outreach. Gwendolyn Gray does not have any issues getting her medications as she does not have to pay out of pocket and has her medications delivered to her.    Gwendolyn Gray Gwendolyn Kruczek RN 07/21/2023 1:24 PM

## 2023-07-21 NOTE — Progress Notes (Signed)
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 of medications and system for tracking them. It also discusses how to avoid risk for bone loss.  Biology of Weight Control  Clinical staff conducted group or individual video education with verbal and written material and guidebook.  Patient learns that weight gain occurs because we consume more calories than we burn (eating more, moving less). Even if your body weight is normal, you may have higher ratios of fat compared to muscle mass. Too much body fat puts you at increased risk for cardiovascular disease, heart attack, stroke, type 2 diabetes, and obesity-related cancers. In addition to exercise, following the Pritikin Eating Plan can help reduce your risk.  Decoding Lab Results  Clinical staff conducted group or individual video education with verbal and written material and  guidebook.  Patient learns that lab test reflects one measurement whose values change over time and are influenced by many factors, including medication, stress, sleep, exercise, food, hydration, pre-existing medical conditions, and more. It is recommended to use the knowledge from this video to become more involved with your lab results and evaluate your numbers to speak with your doctor.   Diseases of Our Time - Overview  Clinical staff conducted group or individual video education with verbal and written material and guidebook.  Patient learns that according to the CDC, 50% to 70% of chronic diseases (such as obesity, type 2 diabetes, elevated lipids, hypertension, and heart disease) are avoidable through lifestyle improvements including healthier food choices, listening to satiety cues, and increased physical activity.  Sleep Disorders Clinical staff conducted group or individual video education with verbal and written material and guidebook.  Patient learns how good quality and duration of sleep are important to overall health and well-being. Patient also learns about sleep disorders and how they impact health along with recommendations to address them, including discussing with a physician.  Nutrition  Dining Out - Part 2 Clinical staff conducted group or individual video education with verbal and written material and guidebook.  Patient learns how to plan ahead and communicate in order to maximize their dining experience in a healthy and nutritious manner. Included are recommended food choices based on the type of restaurant the patient is visiting.   Fueling a Banker conducted group or individual video education with verbal and written material and guidebook.  There is a strong connection between our food choices and our health. Diseases like obesity and type 2 diabetes are very prevalent and are in large-part due to lifestyle choices. The Pritikin Eating Plan  provides plenty of food and hunger-curbing satisfaction. It is easy to follow, affordable, and helps reduce health risks.  Menu Workshop  Clinical staff conducted group or individual video education with verbal and written material and guidebook.  Patient learns that restaurant meals can sabotage health goals because they are often packed with calories, fat, sodium, and sugar. Recommendations include strategies to plan ahead and to communicate with the manager, chef, or server to help order a healthier meal.  Planning Your Eating Strategy  Clinical staff conducted group or individual video education with verbal and written material and guidebook.  Patient learns about the Pritikin Eating Plan and its benefit of reducing the risk of disease. The Pritikin Eating Plan does not focus on calories. Instead, it emphasizes high-quality, nutrient-rich foods. By knowing the characteristics  of the foods, we choose, we can determine their calorie density and make informed decisions.  Targeting Your Nutrition Priorities  Clinical staff conducted group or individual video education with verbal and written material and guidebook.  Patient learns that lifestyle habits have a tremendous impact on disease risk and progression. This video provides eating and physical activity recommendations based on your personal health goals, such as reducing LDL cholesterol, losing weight, preventing or controlling type 2 diabetes, and reducing high blood pressure.  Vitamins and Minerals  Clinical staff conducted group or individual video education with verbal and written material and guidebook.  Patient learns different ways to obtain key vitamins and minerals, including through a recommended healthy diet. It is important to discuss all supplements you take with your doctor.   Healthy Mind-Set    Smoking Cessation  Clinical staff conducted group or individual video education with verbal and written material and guidebook.   Patient learns that cigarette smoking and tobacco addiction pose a serious health risk which affects millions of people. Stopping smoking will significantly reduce the risk of heart disease, lung disease, and many forms of cancer. Recommended strategies for quitting are covered, including working with your doctor to develop a successful plan.  Culinary   Becoming a Set designer conducted group or individual video education with verbal and written material and guidebook.  Patient learns that cooking at home can be healthy, cost-effective, quick, and puts them in control. Keys to cooking healthy recipes will include looking at your recipe, assessing your equipment needs, planning ahead, making it simple, choosing cost-effective seasonal ingredients, and limiting the use of added fats, salts, and sugars.  Cooking - Breakfast and Snacks  Clinical staff conducted group or individual video education with verbal and written material and guidebook.  Patient learns how important breakfast is to satiety and nutrition through the entire day. Recommendations include key foods to eat during breakfast to help stabilize blood sugar levels and to prevent overeating at meals later in the day. Planning ahead is also a key component.  Cooking - Educational psychologist conducted group or individual video education with verbal and written material and guidebook.  Patient learns eating strategies to improve overall health, including an approach to cook more at home. Recommendations include thinking of animal protein as a side on your plate rather than center stage and focusing instead on lower calorie dense options like vegetables, fruits, whole grains, and plant-based proteins, such as beans. Making sauces in large quantities to freeze for later and leaving the skin on your vegetables are also recommended to maximize your experience.  Cooking - Healthy Salads and Dressing Clinical staff  conducted group or individual video education with verbal and written material and guidebook.  Patient learns that vegetables, fruits, whole grains, and legumes are the foundations of the Pritikin Eating Plan. Recommendations include how to incorporate each of these in flavorful and healthy salads, and how to create homemade salad dressings. Proper handling of ingredients is also covered. Cooking - Soups and [employer] - Soups and Desserts Clinical staff conducted group or individual video education with verbal and written material and guidebook.  Patient learns that Pritikin soups and desserts make for easy, nutritious, and delicious snacks and meal components that are low in sodium, fat, sugar, and calorie density, while high in vitamins, minerals, and filling fiber. Recommendations include simple and healthy ideas for soups and desserts.   Overview     The Pritikin Solution  Program Overview Clinical staff conducted group or individual video education with verbal and written material and guidebook.  Patient learns that the results of the Pritikin Program have been documented in more than 100 articles published in peer-reviewed journals, and the benefits include reducing risk factors for (and, in some cases, even reversing) high cholesterol, high blood pressure, type 2 diabetes, obesity, and more! An overview of the three key pillars of the Pritikin Program will be covered: eating well, doing regular exercise, and having a healthy mind-set.  WORKSHOPS  Exercise: Exercise Basics: Building Your Action Plan Clinical staff led group instruction and group discussion with PowerPoint presentation and patient guidebook. To enhance the learning environment the use of posters, models and videos may be added. At the conclusion of this workshop, patients will comprehend the difference between physical activity and exercise, as well as the benefits of incorporating both, into their routine. Patients will  understand the FITT (Frequency, Intensity, Time, and Type) principle and how to use it to build an exercise action plan. In addition, safety concerns and other considerations for exercise and cardiac rehab will be addressed by the presenter. The purpose of this lesson is to promote a comprehensive and effective weekly exercise routine in order to improve patients' overall level of fitness.   Managing Heart Disease: Your Path to a Healthier Heart Clinical staff led group instruction and group discussion with PowerPoint presentation and patient guidebook. To enhance the learning environment the use of posters, models and videos may be added.At the conclusion of this workshop, patients will understand the anatomy and physiology of the heart. Additionally, they will understand how Pritikin's three pillars impact the risk factors, the progression, and the management of heart disease.  The purpose of this lesson is to provide a high-level overview of the heart, heart disease, and how the Pritikin lifestyle positively impacts risk factors.  Exercise Biomechanics Clinical staff led group instruction and group discussion with PowerPoint presentation and patient guidebook. To enhance the learning environment the use of posters, models and videos may be added. Patients will learn how the structural parts of their bodies function and how these functions impact their daily activities, movement, and exercise. Patients will learn how to promote a neutral spine, learn how to manage pain, and identify ways to improve their physical movement in order to promote healthy living. The purpose of this lesson is to expose patients to common physical limitations that impact physical activity. Participants will learn practical ways to adapt and manage aches and pains, and to minimize their effect on regular exercise. Patients will learn how to maintain good posture while sitting, walking, and lifting.  Balance Training  and Fall Prevention  Clinical staff led group instruction and group discussion with PowerPoint presentation and patient guidebook. To enhance the learning environment the use of posters, models and videos may be added. At the conclusion of this workshop, patients will understand the importance of their sensorimotor skills (vision, proprioception, and the vestibular system) in maintaining their ability to balance as they age. Patients will apply a variety of balancing exercises that are appropriate for their current level of function. Patients will understand the common causes for poor balance, possible solutions to these problems, and ways to modify their physical environment in order to minimize their fall risk. The purpose of this lesson is to teach patients about the importance of maintaining balance as they age and ways to minimize their risk of falling.  WORKSHOPS   Nutrition:  Fueling a Forensic psychologist  Clinical staff led group instruction and group discussion with PowerPoint presentation and patient guidebook. To enhance the learning environment the use of posters, models and videos may be added. Patients will review the foundational principles of the Pritikin Eating Plan and understand what constitutes a serving size in each of the food groups. Patients will also learn Pritikin-friendly foods that are better choices when away from home and review make-ahead meal and snack options. Calorie density will be reviewed and applied to three nutrition priorities: weight maintenance, weight loss, and weight gain. The purpose of this lesson is to reinforce (in a group setting) the key concepts around what patients are recommended to eat and how to apply these guidelines when away from home by planning and selecting Pritikin-friendly options. Patients will understand how calorie density may be adjusted for different weight management goals.  Mindful Eating  Clinical staff led group instruction and group  discussion with PowerPoint presentation and patient guidebook. To enhance the learning environment the use of posters, models and videos may be added. Patients will briefly review the concepts of the Pritikin Eating Plan and the importance of low-calorie dense foods. The concept of mindful eating will be introduced as well as the importance of paying attention to internal hunger signals. Triggers for non-hunger eating and techniques for dealing with triggers will be explored. The purpose of this lesson is to provide patients with the opportunity to review the basic principles of the Pritikin Eating Plan, discuss the value of eating mindfully and how to measure internal cues of hunger and fullness using the Hunger Scale. Patients will also discuss reasons for non-hunger eating and learn strategies to use for controlling emotional eating.  Targeting Your Nutrition Priorities Clinical staff led group instruction and group discussion with PowerPoint presentation and patient guidebook. To enhance the learning environment the use of posters, models and videos may be added. Patients will learn how to determine their genetic susceptibility to disease by reviewing their family history. Patients will gain insight into the importance of diet as part of an overall healthy lifestyle in mitigating the impact of genetics and other environmental insults. The purpose of this lesson is to provide patients with the opportunity to assess their personal nutrition priorities by looking at their family history, their own health history and current risk factors. Patients will also be able to discuss ways of prioritizing and modifying the Pritikin Eating Plan for their highest risk areas  Menu  Clinical staff led group instruction and group discussion with PowerPoint presentation and patient guidebook. To enhance the learning environment the use of posters, models and videos may be added. Using menus brought in from [employer],  or printed from [employer], patients will apply the Pritikin dining out guidelines that were presented in the [employer] video. Patients will also be able to practice these guidelines in a variety of provided scenarios. The purpose of this lesson is to provide patients with the opportunity to practice hands-on learning of the Pritikin Dining Out guidelines with actual menus and practice scenarios.  Label Reading Clinical staff led group instruction and group discussion with PowerPoint presentation and patient guidebook. To enhance the learning environment the use of posters, models and videos may be added. Patients will review and discuss the Pritikin label reading guidelines presented in Pritikin's Label Reading Educational series video. Using fool labels brought in from local grocery stores and markets, patients will apply the label reading guidelines and determine if the packaged food meet the Pritikin  guidelines. The purpose of this lesson is to provide patients with the opportunity to review, discuss, and practice hands-on learning of the Pritikin Label Reading guidelines with actual packaged food labels. Cooking School  Pritikin's [employer] are designed to teach patients ways to prepare quick, simple, and affordable recipes at home. The importance of nutrition's role in chronic disease risk reduction is reflected in its emphasis in the overall Pritikin program. By learning how to prepare essential core Pritikin Eating Plan recipes, patients will increase control over what they eat; be able to customize the flavor of foods without the use of added salt, sugar, or fat; and improve the quality of the food they consume. By learning a set of core recipes which are easily assembled, quickly prepared, and affordable, patients are more likely to prepare more healthy foods at home. These workshops focus on convenient breakfasts, simple entres, side dishes, and desserts which  can be prepared with minimal effort and are consistent with nutrition recommendations for cardiovascular risk reduction. Cooking [employer] are taught by a Armed forces logistics/support/administrative officer (RD) who has been trained by the [employer]. The chef or RD has a clear understanding of the importance of minimizing - if not completely eliminating - added fat, sugar, and sodium in recipes. Throughout the series of Cooking School Workshop sessions, patients will learn about healthy ingredients and efficient methods of cooking to build confidence in their capability to prepare    Cooking School weekly topics:  Adding Flavor- Sodium-Free  Fast and Healthy Breakfasts  Powerhouse Plant-Based Proteins  Satisfying Salads and Dressings  Simple Sides and Sauces  International Cuisine-Spotlight on the [employer] Zones  Delicious Desserts  Savory Soups  [employer] - Meals in a Astronomer Appetizers and Snacks  Comforting Weekend Breakfasts  One-Pot Wonders   Fast Evening Meals  Landscape architect Your Pritikin Plate  WORKSHOPS   Healthy Mindset (Psychosocial):  Focused Goals, Sustainable Changes Clinical staff led group instruction and group discussion with PowerPoint presentation and patient guidebook. To enhance the learning environment the use of posters, models and videos may be added. Patients will be able to apply effective goal setting strategies to establish at least one personal goal, and then take consistent, meaningful action toward that goal. They will learn to identify common barriers to achieving personal goals and develop strategies to overcome them. Patients will also gain an understanding of how our mind-set can impact our ability to achieve goals and the importance of cultivating a positive and growth-oriented mind-set. The purpose of this lesson is to provide patients with a deeper understanding of how to set and achieve personal goals, as well as the tools  and strategies needed to overcome common obstacles which may arise along the way.  From Head to Heart: The Power of a Healthy Outlook  Clinical staff led group instruction and group discussion with PowerPoint presentation and patient guidebook. To enhance the learning environment the use of posters, models and videos may be added. Patients will be able to recognize and describe the impact of emotions and mood on physical health. They will discover the importance of self-care and explore self-care practices which may work for them. Patients will also learn how to utilize the 4 C's to cultivate a healthier outlook and better manage stress and challenges. The purpose of this lesson is to demonstrate to patients how a healthy outlook is an essential part of maintaining good health, especially as they continue their cardiac rehab  journey.  Healthy Sleep for a Healthy Heart Clinical staff led group instruction and group discussion with PowerPoint presentation and patient guidebook. To enhance the learning environment the use of posters, models and videos may be added. At the conclusion of this workshop, patients will be able to demonstrate knowledge of the importance of sleep to overall health, well-being, and quality of life. They will understand the symptoms of, and treatments for, common sleep disorders. Patients will also be able to identify daytime and nighttime behaviors which impact sleep, and they will be able to apply these tools to help manage sleep-related challenges. The purpose of this lesson is to provide patients with a general overview of sleep and outline the importance of quality sleep. Patients will learn about a few of the most common sleep disorders. Patients will also be introduced to the concept of "sleep hygiene," and discover ways to self-manage certain sleeping problems through simple daily behavior changes. Finally, the workshop will motivate patients by clarifying the links between  quality sleep and their goals of heart-healthy living.   Recognizing and Reducing Stress Clinical staff led group instruction and group discussion with PowerPoint presentation and patient guidebook. To enhance the learning environment the use of posters, models and videos may be added. At the conclusion of this workshop, patients will be able to understand the types of stress reactions, differentiate between acute and chronic stress, and recognize the impact that chronic stress has on their health. They will also be able to apply different coping mechanisms, such as reframing negative self-talk. Patients will have the opportunity to practice a variety of stress management techniques, such as deep abdominal breathing, progressive muscle relaxation, and/or guided imagery.  The purpose of this lesson is to educate patients on the role of stress in their lives and to provide healthy techniques for coping with it.  Learning Barriers/Preferences:  Learning Barriers/Preferences - 07/21/23 1502       Learning Barriers/Preferences   Learning Barriers None    Learning Preferences Audio;Computer/Internet;Group Instruction;Individual Instruction;Pictoral;Skilled Demonstration;Verbal Instruction;Video;Written Material          Education Topics:  Knowledge Questionnaire Score:  Knowledge Questionnaire Score - 07/21/23 1503       Knowledge Questionnaire Score   Pre Score 20/24          Core Components/Risk Factors/Patient Goals at Admission:  Personal Goals and Risk Factors at Admission - 07/21/23 1504       Core Components/Risk Factors/Patient Goals on Admission   Improve shortness of breath with ADL's Yes    Intervention Provide education, individualized exercise plan and daily activity instruction to help decrease symptoms of SOB with activities of daily living.    Expected Outcomes Short Term: Improve cardiorespiratory fitness to achieve a reduction of symptoms when performing ADLs;Long  Term: Be able to perform more ADLs without symptoms or delay the onset of symptoms    Lipids Yes    Intervention Provide education and support for participant on nutrition & aerobic/resistive exercise along with prescribed medications to achieve LDL 70mg , HDL >40mg .    Expected Outcomes Short Term: Participant states understanding of desired cholesterol values and is compliant with medications prescribed. Participant is following exercise prescription and nutrition guidelines.;Long Term: Cholesterol controlled with medications as prescribed, with individualized exercise RX and with personalized nutrition plan. Value goals: LDL < 70mg , HDL > 40 mg.    Stress Yes    Intervention Offer individual and/or small group education and counseling on adjustment to heart disease, stress management and health-related  lifestyle change. Teach and support self-help strategies.;Refer participants experiencing significant psychosocial distress to appropriate mental health specialists for further evaluation and treatment. When possible, include family members and significant others in education/counseling sessions.    Expected Outcomes Short Term: Participant demonstrates changes in health-related behavior, relaxation and other stress management skills, ability to obtain effective social support, and compliance with psychotropic medications if prescribed.;Long Term: Emotional wellbeing is indicated by absence of clinically significant psychosocial distress or social isolation.          Core Components/Risk Factors/Patient Goals Review:    Core Components/Risk Factors/Patient Goals at Discharge (Final Review):    ITP Comments:  ITP Comments     Row Name 07/21/23 1145           ITP Comments Gaylyn Keas, MD: Medical Director.  Introduction to the [employer] / Intensive Cardiac Rehab.  Initial orientation packet reviewed with the patient.          Comments:Participant attended orientation  for the cardiac rehabilitation program on  07/21/2023  to perform initial intake and exercise walk test. Patient introduced to the [facility] education and orientation packet was reviewed. Completed 6-minute walk test, measurements, initial ITP, and exercise prescription. Vital signs stable. Telemetry-normal sinus rhythm, with a first degree heart block. Intermittent PAC's. Will notify [facility] cardiology about PAC's. Kaiesha reported having mild shortness of breath during the walk test this resolved with rest. Lamyra uses a rollator for stability. Monte Antonio RN BSN   Service time was from [phone] to [phone].
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
## 2023-07-25 ENCOUNTER — Encounter (HOSPITAL_COMMUNITY)
Admission: RE | Admit: 2023-07-25 | Discharge: 2023-07-25 | Disposition: A | Discharge status: Home / Self Care | Source: Ambulatory Visit | Attending: Cardiology

## 2023-07-25 DIAGNOSIS — Z9889 Other specified postprocedural states: Secondary | ICD-10-CM

## 2023-07-25 NOTE — Progress Notes (Signed)
 Daily Session Note  Patient Details  Name: Gwendolyn Gray MRN: [id number] Date of Birth: 10-Dec-1955 Referring Provider:   Flowsheet Row INTENSIVE CARDIAC REHAB ORIENT from 07/21/2023 in [facility]  Referring Provider Dr. Tomi Verba covering)    Encounter Date: 07/25/2023  Check In:  Session Check In - 07/25/23 1546       Check-In   Supervising physician immediately available to respond to emergencies [facility] MD immediately available    Physician(s) Josefa Beauvais, NP    Location [facility]    Staff Present Alec Finder BS, ACSM-CEP, Exercise Physiologist;Alegandra Sommers, RN, Mallory Parkins, MS, ACSM-CEP, CCRP, Exercise Physiologist;Johnny Fayette, MS, Exercise Physiologist;Olinty Valere, MS, ACSM-CEP, Exercise Physiologist    Virtual Visit No    Medication changes reported     No    Fall or balance concerns reported    No    Tobacco Cessation No Change    Warm-up and Cool-down Performed as group-led instruction   CRP2 orientation today   Resistance Training Performed Yes    VAD Patient? No    PAD/SET Patient? No      Pain Assessment   Currently in Pain? No/denies    Pain Score 0-No pain    Multiple Pain Sites No          Capillary Blood Glucose: No results found for this or any previous visit (from the past 24 hours).   Exercise Prescription Changes - 07/25/23 1633       Response to Exercise   Blood Pressure (Admit) 128/82    Blood Pressure (Exercise) 142/78    Blood Pressure (Exit) 126/80    Heart Rate (Admit) 80 bpm    Heart Rate (Exercise) 107 bpm    Heart Rate (Exit) 82 bpm    Rating of Perceived Exertion (Exercise) 10.5    Perceived Dyspnea (Exercise) 0    Symptoms 0    Comments Pt first day in the [facility] program    Duration Progress to 30 minutes of  aerobic without signs/symptoms of physical distress    Intensity THRR unchanged      Progression   Progression  Continue to progress workloads to maintain intensity without signs/symptoms of physical distress.    Average METs 2.1      Resistance Training   Training Prescription Yes    Weight 2    Reps 10-15    Time 10 Minutes      Recumbant Bike   Level 1    RPM 67    Watts 18    Minutes 15    METs 2.2      NuStep   Level 1    SPM 86    Minutes 15    METs 2          Social History   Tobacco Use  Smoking Status Never  Smokeless Tobacco Never    Goals Met:  Exercise tolerated well No report of concerns or symptoms today Strength training completed today  Goals Unmet:  Not Applicable  Comments: Pt started cardiac rehab today.  Pt tolerated light exercise without difficulty. VSS, telemetry-Sinus Rhythm, asymptomatic.  Medication list reconciled. Pt denies barriers to medicaiton compliance.  PSYCHOSOCIAL ASSESSMENT:  PHQ-10 . Will review quality of life in the upcoming week   Pt enjoys spending time with friends, exercising, biking walking and dance.   Pt oriented to exercise equipment and routine.    Understanding verbalized. [name]  RN BSN    Dr. Wilbert Bihari is Medical Director for Cardiac Rehab at [facility].

## 2023-07-27 ENCOUNTER — Encounter (HOSPITAL_COMMUNITY)

## 2023-07-29 ENCOUNTER — Encounter (HOSPITAL_COMMUNITY)

## 2023-08-01 ENCOUNTER — Encounter (HOSPITAL_COMMUNITY)

## 2023-08-03 ENCOUNTER — Encounter (HOSPITAL_COMMUNITY)
Admission: RE | Admit: 2023-08-03 | Discharge: 2023-08-03 | Disposition: A | Discharge status: Home / Self Care | Source: Ambulatory Visit | Attending: Cardiology | Admitting: Cardiology

## 2023-08-03 DIAGNOSIS — Z9889 Other specified postprocedural states: Secondary | ICD-10-CM | POA: Diagnosis present

## 2023-08-04 ENCOUNTER — Emergency Department (HOSPITAL_COMMUNITY)

## 2023-08-04 ENCOUNTER — Other Ambulatory Visit: Payer: Self-pay

## 2023-08-04 ENCOUNTER — Emergency Department (HOSPITAL_COMMUNITY)
Admission: EM | Admit: 2023-08-04 | Discharge: 2023-08-04 | Disposition: A | Discharge status: Home / Self Care | Attending: Emergency Medicine | Admitting: Emergency Medicine

## 2023-08-04 ENCOUNTER — Encounter (HOSPITAL_COMMUNITY): Payer: Self-pay | Admitting: *Deleted

## 2023-08-04 DIAGNOSIS — J181 Lobar pneumonia, unspecified organism: Secondary | ICD-10-CM | POA: Insufficient documentation

## 2023-08-04 DIAGNOSIS — R0602 Shortness of breath: Secondary | ICD-10-CM | POA: Diagnosis present

## 2023-08-04 DIAGNOSIS — J189 Pneumonia, unspecified organism: Secondary | ICD-10-CM

## 2023-08-04 DIAGNOSIS — Z7982 Long term (current) use of aspirin: Secondary | ICD-10-CM | POA: Diagnosis not present

## 2023-08-04 DIAGNOSIS — Z21 Asymptomatic human immunodeficiency virus [HIV] infection status: Secondary | ICD-10-CM | POA: Diagnosis not present

## 2023-08-04 LAB — TROPONIN I (HIGH SENSITIVITY): Troponin I (High Sensitivity): 5 ng/L (ref <18)

## 2023-08-04 LAB — CBC
HCT: 35.9 % — ABNORMAL LOW (ref 36.0–46.0)
Hemoglobin: 11.4 g/dL — ABNORMAL LOW (ref 12.0–15.0)
MCH: 28.9 pg (ref 26.0–34.0)
MCHC: 31.8 g/dL (ref 30.0–36.0)
MCV: 90.9 fL (ref 80.0–100.0)
Platelets: 234 10*3/uL (ref 150–400)
RBC: 3.95 MIL/uL (ref 3.87–5.11)
RDW: 14.4 % (ref 11.5–15.5)
WBC: 3.5 10*3/uL — ABNORMAL LOW (ref 4.0–10.5)
nRBC: 0 % (ref 0.0–0.2)

## 2023-08-04 LAB — BRAIN NATRIURETIC PEPTIDE: B Natriuretic Peptide: 14.2 pg/mL (ref 0.0–100.0)

## 2023-08-04 LAB — D-DIMER, QUANTITATIVE: D-Dimer, Quant: 0.36 ug{FEU}/mL (ref 0.00–0.50)

## 2023-08-04 LAB — BASIC METABOLIC PANEL WITH GFR
Anion gap: 9 (ref 5–15)
BUN: 10 mg/dL (ref 8–23)
CO2: 25 mmol/L (ref 22–32)
Calcium: 9.5 mg/dL (ref 8.9–10.3)
Chloride: 107 mmol/L (ref 98–111)
Creatinine, Ser: 0.92 mg/dL (ref 0.44–1.00)
GFR, Estimated: >60 mL/min (ref >60)
Glucose, Bld: 89 mg/dL (ref 70–99)
Potassium: 3.9 mmol/L (ref 3.5–5.1)
Sodium: 141 mmol/L (ref 135–145)

## 2023-08-04 MED ORDER — DOXYCYCLINE HYCLATE 100 MG PO CAPS: 100.0000 mg | ORAL_CAPSULE | Freq: Two times a day (BID) | ORAL | 0 refills | Status: AC

## 2023-08-04 MED ORDER — AMOXICILLIN-POT CLAVULANATE 875-125 MG PO TABS
1.0000 | ORAL_TABLET | Freq: Once | ORAL | Status: AC
  Administered 2023-08-04: 1 via ORAL
  Filled 2023-08-04: qty 1

## 2023-08-04 MED ORDER — DOXYCYCLINE HYCLATE 100 MG PO TABS
100.0000 mg | ORAL_TABLET | Freq: Once | ORAL | Status: AC
  Administered 2023-08-04: 100 mg via ORAL
  Filled 2023-08-04: qty 1

## 2023-08-04 MED ORDER — AMOXICILLIN-POT CLAVULANATE 875-125 MG PO TABS: 1.0000 | ORAL_TABLET | Freq: Two times a day (BID) | ORAL | 0 refills | Status: AC

## 2023-08-04 NOTE — ED Triage Notes (Addendum)
 Pt states that she has had sob since she had surgery for a leaky heart valve  04/16/2023.  Pt denies any CP, sob is increases when she lays down, bends down.

## 2023-08-04 NOTE — Discharge Instructions (Addendum)
 We evaluated you for your shortness of breath.  Your testing in the emergency department is reassuring.  Your cardiac enzyme testing test was negative.  Your other tests looking for signs of heart failure or blood clots in your lungs were also normal.  Your chest x-ray showed a possible area of pneumonia in the right lower lung.  We have prescribed you a course of antibiotics.  Please follow-up closely with your primary physician.  If you have any worsening or recurrent symptoms, please return to the emergency department for reassessment.

## 2023-08-04 NOTE — ED Triage Notes (Signed)
 PT arrives via POV. Reports ongoing sob since after having heart surgery back in April. States the sob has recently worsened. Denies cp at this time. Pt is AxOx4.

## 2023-08-04 NOTE — ED Provider Triage Note (Signed)
 Emergency Medicine Provider Triage Evaluation Note  Gwendolyn Gray , a 68 y.o. female  was evaluated in triage.  Pt complains of shortness of breath.  Patient with past history significant for HIV, mitral valve repair presents the emergency department with about 3 months of progressive shortness of breath.  Reports that this shortness of breath worsens when trying to lay flat.  Has previously been seen by [facility] health cardiology  Review of Systems  Positive: As above Negative: As above  Physical Exam  BP 128/82 (BP Location: Right Arm)   Pulse 81   Temp 98.3 F (36.8 C)   Resp 18   SpO2 97%  Gen:   Awake, no distress Resp:  Normal effort, no wheezing, rales, rhonchi MSK:   Moves extremities without difficulty  Other:  No appreciable lower extremity pitting edema.  Medical Decision Making  Medically screening exam initiated at 12:47 PM.  Appropriate orders placed.  Payeton Malcolm was informed that the remainder of the evaluation will be completed by another provider, this initial triage assessment does not replace that evaluation, and the importance of remaining in the ED until their evaluation is complete.     Brodie Scovell A, PA-C 08/04/23 1248

## 2023-08-04 NOTE — ED Provider Notes (Signed)
 [place] EMERGENCY DEPARTMENT AT [facility] Provider Note  CSN: [id number] Arrival date & time: 08/04/23 1155  Chief Complaint(s) Shortness of Breath  HPI Gwendolyn Gray is a 68 y.o. female history of well-controlled HIV, hyperlipidemia, mitral valve repair presenting to the emergency department with shortness of breath.  Patient reports shortness of breath around 2 weeks.  Has been going to cardiac rehab and they advised her to come to the ER.  Reports she has some mild anterior chest pain which is occasionally pleuritic.  No cough.  No fevers or chills.  No runny nose or sore throat.  No back pain.  No leg swelling.  Symptoms worse with lying flat.  No recent travel or surgeries, mitral valve repair was 3 months ago.   Past Medical History Past Medical History:  Diagnosis Date   Allergic rhinitis    Cystitis    Depression    Genital herpes    HIV (human immunodeficiency virus infection) (HCC)    HIV (human immunodeficiency virus infection) (HCC)    Hyperlipidemia    Sleep apnea    Patient Active Problem List   Diagnosis Date Noted   HIV DISEASE 03/22/2006   Home Medication(s) Prior to Admission medications   Medication Sig Start Date End Date Taking? Authorizing Provider  amoxicillin -clavulanate (AUGMENTIN ) 875-125 MG tablet Take 1 tablet by mouth every 12 (twelve) hours. 08/04/23  Yes Francesca Elsie CROME, MD  doxycycline  (VIBRAMYCIN ) 100 MG capsule Take 1 capsule (100 mg total) by mouth 2 (two) times daily for 7 days. 08/04/23 08/11/23 Yes Francesca Elsie CROME, MD  acetaminophen  (TYLENOL ) 500 MG tablet Take 1 tablet (500 mg total) by mouth every 6 (six) hours as needed. 03/10/22   Nivia Colon, PA-C  acyclovir ointment (ZOVIRAX) 5 % Apply 1 application topically every 3 (three) hours. Patient not taking: Reported on 07/21/2023    [provider]  aspirin 81 MG chewable tablet Chew 81 mg by mouth. 05/24/23 05/23/24  [provider]  DOVATO 50-300 MG tablet Take  1 tablet by mouth daily.    [provider]  etodolac (LODINE) 500 MG tablet Take 500 mg by mouth 2 (two) times daily. 04/27/23   [provider]  HYDROcodone -acetaminophen  (NORCO) 5-325 MG tablet Take 1-2 tablets by mouth every 6 (six) hours as needed. Patient not taking: Reported on 12/06/2017 01/31/15   Geroldine Berg, MD  ipratropium (ATROVENT) 0.03 % nasal spray 2 sprays 2 (two) times daily as needed. 06/16/23 06/15/24  [provider]  meclizine  (ANTIVERT ) 25 MG tablet Take 1 tablet (25 mg total) by mouth 3 (three) times daily as needed for dizziness. 03/10/22   Nivia Colon, PA-C  methocarbamol  (ROBAXIN ) 500 MG tablet Take 1 tablet (500 mg total) by mouth 2 (two) times daily. Patient taking differently: Take 500 mg by mouth 2 (two) times daily as needed. 03/16/18   Kehrli, Kelsey F, PA-C  Multiple Vitamin (MULTIVITAMIN) tablet Take 1 tablet by mouth daily.    [provider]  rosuvastatin (CRESTOR) 5 MG tablet Take 5 mg by mouth daily. 01/10/23 01/10/24  [provider]  traMADol (ULTRAM) 50 MG tablet Take 50 mg by mouth every 8 (eight) hours as needed. 06/01/23   [provider]  valACYclovir (VALTREX) 500 MG tablet Take 500 mg by mouth 2 (two) times daily. Patient taking differently: Take 500 mg by mouth as needed.    [provider]  vitamin B-12 (CYANOCOBALAMIN) 500 MCG tablet Take 500 mcg by mouth daily.  [provider]  Vitamin D, Ergocalciferol, (DRISDOL) 50000 units CAPS capsule Take 50,000 Units by mouth every 7 (seven) days.    [provider]                                                                                                                                    Past Surgical History Past Surgical History:  Procedure Laterality Date   CARDIAC CATHETERIZATION     COLONOSCOPY WITH PROPOFOL  N/A 12/06/2017   Procedure: COLONOSCOPY WITH PROPOFOL ;  Surgeon: Toledo, Ladell POUR, MD;  Location: [facility]  ENDOSCOPY;  Service: Gastroenterology;  Laterality: N/A;   mitral valve repair  05/17/2023   At [facility]   Family History History reviewed. No pertinent family history.  Social History Social History   Tobacco Use   Smoking status: Never   Smokeless tobacco: Never  Vaping Use   Vaping status: Never Used  Substance Use Topics   Alcohol use: No   Drug use: Never   Allergies Gabapentin  Review of Systems Review of Systems  All other systems reviewed and are negative.   Physical Exam Vital Signs  I have reviewed the triage vital signs BP (!) 144/78   Pulse 88   Temp 98.4 F (36.9 C) (Oral)   Resp 20   Ht 5' 3 (1.6 m)   Wt 74.9 kg   SpO2 100%   BMI 29.25 kg/m  Physical Exam Vitals and nursing note reviewed.  Constitutional:      General: She is not in acute distress.    Appearance: She is well-developed.  HENT:     Head: Normocephalic and atraumatic.     Mouth/Throat:     Mouth: Mucous membranes are moist.  Eyes:     Pupils: Pupils are equal, round, and reactive to light.  Cardiovascular:     Rate and Rhythm: Normal rate and regular rhythm.     Heart sounds: No murmur heard. Pulmonary:     Effort: Pulmonary effort is normal. No respiratory distress.     Breath sounds: Normal breath sounds.  Abdominal:     General: Abdomen is flat.     Palpations: Abdomen is soft.     Tenderness: There is no abdominal tenderness.  Musculoskeletal:        General: No tenderness.     Right lower leg: No edema.     Left lower leg: No edema.  Skin:    General: Skin is warm and dry.  Neurological:     General: No focal deficit present.     Mental Status: She is alert. Mental status is at baseline.  Psychiatric:        Mood and Affect: Mood normal.        Behavior: Behavior normal.     ED Results and Treatments Labs (all labs ordered are listed, but only abnormal results are displayed) Labs Reviewed  CBC -  Abnormal; Notable for the  following components:      Result Value   WBC 3.5 (*)    Hemoglobin 11.4 (*)    HCT 35.9 (*)    All other components within normal limits  BASIC METABOLIC PANEL WITH GFR  BRAIN NATRIURETIC PEPTIDE  D-DIMER, QUANTITATIVE  TROPONIN I (HIGH SENSITIVITY)  TROPONIN I (HIGH SENSITIVITY)                                                                                                                          Radiology DG Chest 2 View Result Date: 08/04/2023 CLINICAL DATA:  Shortness of breath. EXAM: CHEST - 2 VIEW COMPARISON:  None Available. FINDINGS: Right lung base opacity may represent atelectasis or pneumonia. The left lung is clear. No pleural effusion pneumothorax. Mild cardiomegaly. Cardiac valve repair. No acute osseous pathology. IMPRESSION: Right lung base atelectasis or pneumonia. Electronically Signed   By: Vanetta Chou M.D.   On: 08/04/2023 13:03    Pertinent labs & imaging results that were available during my care of the patient were reviewed by me and considered in my medical decision making (see MDM for details).  Medications Ordered in ED Medications  amoxicillin -clavulanate (AUGMENTIN ) 875-125 MG per tablet 1 tablet (1 tablet Oral Given 08/04/23 2315)  doxycycline  (VIBRA -TABS) tablet 100 mg (100 mg Oral Given 08/04/23 2316)                                                                                                                                     Procedures Procedures  (including critical care time)  Medical Decision Making / ED Course   MDM:  68 year old presenting to the emergency department with shortness of breath.  Patient overall well-appearing, physical examination with no focal finding.  Lungs seem clear on exam.  No JVD.  No leg swelling.  Symptoms sound somewhat concerning for CHF however with clear lungs, normal BNP and no peripheral edema low concern for this.  Patient does report some chest pain, troponin is negative with 2 weeks of symptoms,  doubt ACS.  Differential also includes pulmonary embolism will check D-dimer.  Chest x-ray with possible right lower lung atelectasis versus pneumonia, could be conceivable that symptoms are related to pneumonia although patient not having cough, labs without leukocytosis.  No evidence of pneumothorax on chest x-ray.  No wheezing to suggest COPD or bronchospasm.  Will reassess.  If D-dimer is negative may give course of antibiotics to see if this helps.  Patient will need to follow-up closely with her outpatient team.  Clinical Course as of 08/05/23 0018  Thu Aug 04, 2023  2300 Workup negative including negative D-dimer.  Feel patient is stable for discharge to home.  Given infiltrate versus atelectasis in the right lung base without other clear cause of the patient's symptoms, we will trial course of antibiotics.  Recommended close follow-up with her primary physician and cardiologist at [facility] Will discharge patient to home. All questions answered. Patient comfortable with plan of discharge. Return precautions discussed with patient and specified on the after visit summary.  [WS]    Clinical Course User Index [WS] Francesca Elsie CROME, MD     Additional history obtained:  -External records from outside source obtained and reviewed including: Chart review including previous notes, labs, imaging, consultation notes including previous notes    Lab Tests: -I ordered, reviewed, and interpreted labs.   The pertinent results include:   Labs Reviewed  CBC - Abnormal; Notable for the following components:      Result Value   WBC 3.5 (*)    Hemoglobin 11.4 (*)    HCT 35.9 (*)    All other components within normal limits  BASIC METABOLIC PANEL WITH GFR  BRAIN NATRIURETIC PEPTIDE  D-DIMER, QUANTITATIVE  TROPONIN I (HIGH SENSITIVITY)  TROPONIN I (HIGH SENSITIVITY)    Notable for mild anemia, leukopenia   EKG   EKG Interpretation Date/Time:    Ventricular Rate:    PR Interval:    QRS  Duration:    QT Interval:    QTC Calculation:   R Axis:      Text Interpretation:           Imaging Studies ordered: I ordered imaging studies including CXR On my interpretation imaging demonstrates possible infiltrate RLL I independently visualized and interpreted imaging. I agree with the radiologist interpretation   Medicines ordered and prescription drug management: Meds ordered this encounter  Medications   amoxicillin -clavulanate (AUGMENTIN ) 875-125 MG per tablet 1 tablet   doxycycline  (VIBRA -TABS) tablet 100 mg   amoxicillin -clavulanate (AUGMENTIN ) 875-125 MG tablet    Sig: Take 1 tablet by mouth every 12 (twelve) hours.    Dispense:  14 tablet    Refill:  0   doxycycline  (VIBRAMYCIN ) 100 MG capsule    Sig: Take 1 capsule (100 mg total) by mouth 2 (two) times daily for 7 days.    Dispense:  14 capsule    Refill:  0    -I have reviewed the patients home medicines and have made adjustments as needed   Reevaluation: After the interventions noted above, I reevaluated the patient and found that their symptoms have improved  Co morbidities that complicate the patient evaluation  Past Medical History:  Diagnosis Date   Allergic rhinitis    Cystitis    Depression    Genital herpes    HIV (human immunodeficiency virus infection) (HCC)    HIV (human immunodeficiency virus infection) (HCC)    Hyperlipidemia    Sleep apnea       Dispostion: Disposition decision including need for hospitalization was considered, and patient discharged from emergency department.    Final Clinical Impression(s) / ED Diagnoses Final diagnoses:  Shortness of breath  Pneumonia of right lower lobe due to infectious organism     This chart was dictated using voice recognition software.  Despite best efforts to proofread,  errors can  occur which can change the documentation meaning.    Francesca Elsie CROME, MD 08/05/23 [phone]

## 2023-08-08 ENCOUNTER — Encounter (HOSPITAL_COMMUNITY)
Admission: RE | Admit: 2023-08-08 | Discharge: 2023-08-08 | Disposition: A | Discharge status: Home / Self Care | Source: Ambulatory Visit | Attending: Cardiology | Admitting: Cardiology

## 2023-08-08 DIAGNOSIS — Z9889 Other specified postprocedural states: Secondary | ICD-10-CM

## 2023-08-08 NOTE — Progress Notes (Addendum)
 Patient was noted to be in the emergency room with shortness of breath on 08/04/23. Will hold exercise until she gets clearance from [facility] to return to exercise. Patient states understanding. [name] RN BSN

## 2023-08-10 ENCOUNTER — Encounter (HOSPITAL_COMMUNITY)

## 2023-08-12 ENCOUNTER — Encounter (HOSPITAL_COMMUNITY)

## 2023-08-15 ENCOUNTER — Encounter (HOSPITAL_COMMUNITY): Admission: RE | Admit: 2023-08-15 | Source: Ambulatory Visit

## 2023-08-16 NOTE — Progress Notes (Signed)
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  Patient learns that body composition (ratio of muscle mass to fat mass) is a key component to assessing overall fitness, rather than body weight alone. Increased fat mass, especially visceral belly fat, can put us  at increased risk for metabolic syndrome, type 2 diabetes, heart disease, and  even death. It is recommended to combine diet and exercise (cardiovascular and resistance training) to improve your body composition. Seek guidance from your physician and exercise physiologist before  implementing an exercise routine.  Exercise Action Plan Clinical staff conducted group or individual video education with verbal and written material and guidebook.  Patient learns the recommended strategies to achieve and enjoy long-term exercise adherence, including variety, self-motivation, self-efficacy, and positive decision making. Benefits of exercise include fitness, good health, weight management, more energy, better sleep, less stress, and overall well-being.  Medical   Heart Disease Risk Reduction Clinical staff conducted group or individual video education with verbal and written material and guidebook.  Patient learns our heart is our most vital organ as it circulates oxygen, nutrients, white blood cells, and hormones throughout the entire body, and carries waste away. Data supports a plant-based eating plan like the Pritikin Program for its effectiveness in slowing progression of and reversing heart disease. The video provides a number of recommendations to address heart disease.   Metabolic Syndrome and Belly Fat  Clinical staff conducted group or individual video education with verbal and written material and guidebook.  Patient learns what metabolic syndrome is, how it leads to heart disease, and how one can reverse it and keep it from coming back. You have metabolic syndrome if you have 3 of the following 5 criteria: abdominal obesity, high blood pressure, high triglycerides, low HDL cholesterol, and high blood sugar.  Hypertension and Heart Disease Clinical staff conducted group or individual video education with verbal and written material and guidebook.  Patient learns that high blood pressure, or hypertension, is very common in the [place] . Hypertension is largely due to excessive salt intake, but other important risk factors include being overweight, physical inactivity, drinking too much alcohol, smoking, and not eating enough potassium from fruits and vegetables. High  blood pressure is a leading risk factor for heart attack, stroke, congestive heart failure, dementia, kidney failure, and premature death. Long-term effects of excessive salt intake include stiffening of the arteries and thickening of heart muscle and organ damage. Recommendations include ways to reduce hypertension and the risk of heart disease.  Diseases of Our Time - Focusing on Diabetes Clinical staff conducted group or individual video education with verbal and written material and guidebook.  Patient learns why the best way to stop diseases of our time is prevention, through food and other lifestyle changes. Medicine (such as prescription pills and surgeries) is often only a Band-Aid on the problem, not a long-term solution. Most common diseases of our time include obesity, type 2 diabetes, hypertension, heart disease, and cancer. The Pritikin Program is recommended and has been proven to help reduce, reverse, and/or prevent the damaging effects of metabolic syndrome.  Nutrition   Overview of the Pritikin Eating Plan  Clinical staff conducted group or individual video education with verbal and written material and guidebook.  Patient learns about the Pritikin Eating Plan for disease risk reduction. The Pritikin Eating Plan emphasizes a wide variety of unrefined, minimally-processed carbohydrates, like fruits, vegetables, whole grains, and legumes. Go, Caution, and Stop food choices are explained. Plant-based and lean animal proteins are emphasized. Rationale provided for low sodium intake for blood pressure control, low added sugars for blood sugar stabilization, and low added fats and oils for coronary artery disease risk reduction and weight management.  Calorie Density  Clinical staff conducted group or individual video education with verbal and written material and guidebook.  Patient learns about calorie density  and how it impacts the Pritikin Eating Plan. Knowing the characteristics of the  food you choose will help you decide whether those foods will lead to weight gain or weight loss, and whether you want to consume more or less of them. Weight loss is usually a side effect of the Pritikin Eating Plan because of its focus on low calorie-dense foods.  Label Reading  Clinical staff conducted group or individual video education with verbal and written material and guidebook.  Patient learns about the Pritikin recommended label reading guidelines and corresponding recommendations regarding calorie density, added sugars, sodium content, and whole grains.  Dining Out - Part 1  Clinical staff conducted group or individual video education with verbal and written material and guidebook.  Patient learns that restaurant meals can be sabotaging because they can be so high in calories, fat, sodium, and/or sugar. Patient learns recommended strategies on how to positively address this and avoid unhealthy pitfalls.  Facts on Fats  Clinical staff conducted group or individual video education with verbal and written material and guidebook.  Patient learns that lifestyle modifications can be just as effective, if not more so, as many medications for lowering your risk of heart disease. A Pritikin lifestyle can help to reduce your risk of inflammation and atherosclerosis (cholesterol build-up, or plaque, in the artery walls). Lifestyle interventions such as dietary choices and physical activity address the cause of atherosclerosis. A review of the types of fats and their impact on blood cholesterol levels, along with dietary recommendations to reduce fat intake is also included.  Nutrition Action Plan  Clinical staff conducted group or individual video education with verbal and written material and guidebook.  Patient learns how to incorporate Pritikin recommendations into their lifestyle. Recommendations include planning and keeping personal health goals in mind as an important part of their  success.  Healthy Mind-Set    Healthy Minds, Bodies, Hearts  Clinical staff conducted group or individual video education with verbal and written material and guidebook.  Patient learns how to identify when they are stressed. Video will discuss the impact of that stress, as well as the many benefits of stress management. Patient will also be introduced to stress management techniques. The way we think, act, and feel has an impact on our hearts.  How Our Thoughts Can Heal Our Hearts  Clinical staff conducted group or individual video education with verbal and written material and guidebook.  Patient learns that negative thoughts can cause depression and anxiety. This can result in negative lifestyle behavior and serious health problems. Cognitive behavioral therapy is an effective method to help control our thoughts in order to change and improve our emotional outlook.  Additional Videos:  Exercise    Improving Performance  Clinical staff conducted group or individual video education with verbal and written material and guidebook.  Patient learns to use a non-linear approach by alternating intensity levels and lengths of time spent exercising to help burn more calories and lose more body fat. Cardiovascular exercise helps improve heart health, metabolism, hormonal balance, blood sugar control, and recovery from fatigue. Resistance training improves strength, endurance, balance, coordination, reaction time, metabolism, and muscle mass. Flexibility exercise improves circulation, posture, and balance. Seek guidance from your physician and exercise physiologist before implementing an exercise routine and learn your capabilities and proper form for all exercise.  Introduction to Yoga  Clinical staff conducted group or individual video education with verbal and written material and guidebook.  Patient learns about yoga, a discipline of the  coming together of mind, breath, and body. The benefits of yoga  include improved flexibility, improved range of motion, better posture and core strength, increased lung function, weight loss, and positive self-image. Yoga's heart health benefits include lowered blood pressure, healthier heart rate, decreased cholesterol and triglyceride levels, improved immune function, and reduced stress. Seek guidance from your physician and exercise physiologist before implementing an exercise routine and learn your capabilities and proper form for all exercise.  Medical   Aging: Enhancing Your Quality of Life  Clinical staff conducted group or individual video education with verbal and written material and guidebook.  Patient learns key strategies and recommendations to stay in good physical health and enhance quality of life, such as prevention strategies, having an advocate, securing a Health Care Proxy and Power of Attorney, and keeping a list of medications and system for tracking them. It also discusses how to avoid risk for bone loss.  Biology of Weight Control  Clinical staff conducted group or individual video education with verbal and written material and guidebook.  Patient learns that weight gain occurs because we consume more calories than we burn (eating more, moving less). Even if your body weight is normal, you may have higher ratios of fat compared to muscle mass. Too much body fat puts you at increased risk for cardiovascular disease, heart attack, stroke, type 2 diabetes, and obesity-related cancers. In addition to exercise, following the Pritikin Eating Plan can help reduce your risk.  Decoding Lab Results  Clinical staff conducted group or individual video education with verbal and written material and guidebook.  Patient learns that lab test reflects one measurement whose values change over time and are influenced by many factors, including medication, stress, sleep, exercise, food, hydration, pre-existing medical conditions, and more. It is recommended to  use the knowledge from this video to become more involved with your lab results and evaluate your numbers to speak with your doctor.   Diseases of Our Time - Overview  Clinical staff conducted group or individual video education with verbal and written material and guidebook.  Patient learns that according to the CDC, 50% to 70% of chronic diseases (such as obesity, type 2 diabetes, elevated lipids, hypertension, and heart disease) are avoidable through lifestyle improvements including healthier food choices, listening to satiety cues, and increased physical activity.  Sleep Disorders Clinical staff conducted group or individual video education with verbal and written material and guidebook.  Patient learns how good quality and duration of sleep are important to overall health and well-being. Patient also learns about sleep disorders and how they impact health along with recommendations to address them, including discussing with a physician.  Nutrition  Dining Out - Part 2 Clinical staff conducted group or individual video education with verbal and written material and guidebook.  Patient learns how to plan ahead and communicate in order to maximize their dining experience in a healthy and nutritious manner. Included are recommended food choices based on the type of restaurant the patient is visiting.   Fueling a Banker conducted group or individual video education with verbal and written material and guidebook.  There is a strong connection between our food choices and our health. Diseases like obesity and type 2 diabetes are very prevalent and are in large-part due to lifestyle choices. The Pritikin Eating Plan provides plenty of food and hunger-curbing satisfaction. It is easy to follow, affordable, and helps reduce health risks.  Menu Workshop  Clinical staff conducted group or individual  video education with verbal and written material and guidebook.  Patient learns  that restaurant meals can sabotage health goals because they are often packed with calories, fat, sodium, and sugar. Recommendations include strategies to plan ahead and to communicate with the manager, chef, or server to help order a healthier meal.  Planning Your Eating Strategy  Clinical staff conducted group or individual video education with verbal and written material and guidebook.  Patient learns about the Pritikin Eating Plan and its benefit of reducing the risk of disease. The Pritikin Eating Plan does not focus on calories. Instead, it emphasizes high-quality, nutrient-rich foods. By knowing the characteristics of the foods, we choose, we can determine their calorie density and make informed decisions.  Targeting Your Nutrition Priorities  Clinical staff conducted group or individual video education with verbal and written material and guidebook.  Patient learns that lifestyle habits have a tremendous impact on disease risk and progression. This video provides eating and physical activity recommendations based on your personal health goals, such as reducing LDL cholesterol, losing weight, preventing or controlling type 2 diabetes, and reducing high blood pressure.  Vitamins and Minerals  Clinical staff conducted group or individual video education with verbal and written material and guidebook.  Patient learns different ways to obtain key vitamins and minerals, including through a recommended healthy diet. It is important to discuss all supplements you take with your doctor.   Healthy Mind-Set    Smoking Cessation  Clinical staff conducted group or individual video education with verbal and written material and guidebook.  Patient learns that cigarette smoking and tobacco addiction pose a serious health risk which affects millions of people. Stopping smoking will significantly reduce the risk of heart disease, lung disease, and many forms of cancer. Recommended strategies for quitting  are covered, including working with your doctor to develop a successful plan.  Culinary   Becoming a Set designer conducted group or individual video education with verbal and written material and guidebook.  Patient learns that cooking at home can be healthy, cost-effective, quick, and puts them in control. Keys to cooking healthy recipes will include looking at your recipe, assessing your equipment needs, planning ahead, making it simple, choosing cost-effective seasonal ingredients, and limiting the use of added fats, salts, and sugars.  Cooking - Breakfast and Snacks  Clinical staff conducted group or individual video education with verbal and written material and guidebook.  Patient learns how important breakfast is to satiety and nutrition through the entire day. Recommendations include key foods to eat during breakfast to help stabilize blood sugar levels and to prevent overeating at meals later in the day. Planning ahead is also a key component.  Cooking - Educational psychologist conducted group or individual video education with verbal and written material and guidebook.  Patient learns eating strategies to improve overall health, including an approach to cook more at home. Recommendations include thinking of animal protein as a side on your plate rather than center stage and focusing instead on lower calorie dense options like vegetables, fruits, whole grains, and plant-based proteins, such as beans. Making sauces in large quantities to freeze for later and leaving the skin on your vegetables are also recommended to maximize your experience.  Cooking - Healthy Salads and Dressing Clinical staff conducted group or individual video education with verbal and written material and guidebook.  Patient learns that vegetables, fruits, whole grains, and legumes are the foundations of the Pritikin Eating Plan. Recommendations  include how to incorporate each of these in  flavorful and healthy salads, and how to create homemade salad dressings. Proper handling of ingredients is also covered. Cooking - Soups and [employer] - Soups and Desserts Clinical staff conducted group or individual video education with verbal and written material and guidebook.  Patient learns that Pritikin soups and desserts make for easy, nutritious, and delicious snacks and meal components that are low in sodium, fat, sugar, and calorie density, while high in vitamins, minerals, and filling fiber. Recommendations include simple and healthy ideas for soups and desserts.   Overview     The Pritikin Solution Program Overview Clinical staff conducted group or individual video education with verbal and written material and guidebook.  Patient learns that the results of the Pritikin Program have been documented in more than 100 articles published in peer-reviewed journals, and the benefits include reducing risk factors for (and, in some cases, even reversing) high cholesterol, high blood pressure, type 2 diabetes, obesity, and more! An overview of the three key pillars of the Pritikin Program will be covered: eating well, doing regular exercise, and having a healthy mind-set.  WORKSHOPS  Exercise: Exercise Basics: Building Your Action Plan Clinical staff led group instruction and group discussion with PowerPoint presentation and patient guidebook. To enhance the learning environment the use of posters, models and videos may be added. At the conclusion of this workshop, patients will comprehend the difference between physical activity and exercise, as well as the benefits of incorporating both, into their routine. Patients will understand the FITT (Frequency, Intensity, Time, and Type) principle and how to use it to build an exercise action plan. In addition, safety concerns and other considerations for exercise and cardiac rehab will be addressed by the presenter. The purpose of this  lesson is to promote a comprehensive and effective weekly exercise routine in order to improve patients' overall level of fitness.   Managing Heart Disease: Your Path to a Healthier Heart Clinical staff led group instruction and group discussion with PowerPoint presentation and patient guidebook. To enhance the learning environment the use of posters, models and videos may be added.At the conclusion of this workshop, patients will understand the anatomy and physiology of the heart. Additionally, they will understand how Pritikin's three pillars impact the risk factors, the progression, and the management of heart disease.  The purpose of this lesson is to provide a high-level overview of the heart, heart disease, and how the Pritikin lifestyle positively impacts risk factors.  Exercise Biomechanics Clinical staff led group instruction and group discussion with PowerPoint presentation and patient guidebook. To enhance the learning environment the use of posters, models and videos may be added. Patients will learn how the structural parts of their bodies function and how these functions impact their daily activities, movement, and exercise. Patients will learn how to promote a neutral spine, learn how to manage pain, and identify ways to improve their physical movement in order to promote healthy living. The purpose of this lesson is to expose patients to common physical limitations that impact physical activity. Participants will learn practical ways to adapt and manage aches and pains, and to minimize their effect on regular exercise. Patients will learn how to maintain good posture while sitting, walking, and lifting.  Balance Training and Fall Prevention  Clinical staff led group instruction and group discussion with PowerPoint presentation and patient guidebook. To enhance the learning environment the use of posters, models and videos may be added. At the conclusion  of this workshop,  patients will understand the importance of their sensorimotor skills (vision, proprioception, and the vestibular system) in maintaining their ability to balance as they age. Patients will apply a variety of balancing exercises that are appropriate for their current level of function. Patients will understand the common causes for poor balance, possible solutions to these problems, and ways to modify their physical environment in order to minimize their fall risk. The purpose of this lesson is to teach patients about the importance of maintaining balance as they age and ways to minimize their risk of falling.  WORKSHOPS   Nutrition:  Fueling a Ship broker led group instruction and group discussion with PowerPoint presentation and patient guidebook. To enhance the learning environment the use of posters, models and videos may be added. Patients will review the foundational principles of the Pritikin Eating Plan and understand what constitutes a serving size in each of the food groups. Patients will also learn Pritikin-friendly foods that are better choices when away from home and review make-ahead meal and snack options. Calorie density will be reviewed and applied to three nutrition priorities: weight maintenance, weight loss, and weight gain. The purpose of this lesson is to reinforce (in a group setting) the key concepts around what patients are recommended to eat and how to apply these guidelines when away from home by planning and selecting Pritikin-friendly options. Patients will understand how calorie density may be adjusted for different weight management goals.  Mindful Eating  Clinical staff led group instruction and group discussion with PowerPoint presentation and patient guidebook. To enhance the learning environment the use of posters, models and videos may be added. Patients will briefly review the concepts of the Pritikin Eating Plan and the importance of low-calorie dense  foods. The concept of mindful eating will be introduced as well as the importance of paying attention to internal hunger signals. Triggers for non-hunger eating and techniques for dealing with triggers will be explored. The purpose of this lesson is to provide patients with the opportunity to review the basic principles of the Pritikin Eating Plan, discuss the value of eating mindfully and how to measure internal cues of hunger and fullness using the Hunger Scale. Patients will also discuss reasons for non-hunger eating and learn strategies to use for controlling emotional eating.  Targeting Your Nutrition Priorities Clinical staff led group instruction and group discussion with PowerPoint presentation and patient guidebook. To enhance the learning environment the use of posters, models and videos may be added. Patients will learn how to determine their genetic susceptibility to disease by reviewing their family history. Patients will gain insight into the importance of diet as part of an overall healthy lifestyle in mitigating the impact of genetics and other environmental insults. The purpose of this lesson is to provide patients with the opportunity to assess their personal nutrition priorities by looking at their family history, their own health history and current risk factors. Patients will also be able to discuss ways of prioritizing and modifying the Pritikin Eating Plan for their highest risk areas  Menu  Clinical staff led group instruction and group discussion with PowerPoint presentation and patient guidebook. To enhance the learning environment the use of posters, models and videos may be added. Using menus brought in from [employer], or printed from [employer], patients will apply the Pritikin dining out guidelines that were presented in the [employer] video. Patients will also be able to practice these guidelines in a variety  of provided scenarios. The purpose of  this lesson is to provide patients with the opportunity to practice hands-on learning of the Pritikin Dining Out guidelines with actual menus and practice scenarios.  Label Reading Clinical staff led group instruction and group discussion with PowerPoint presentation and patient guidebook. To enhance the learning environment the use of posters, models and videos may be added. Patients will review and discuss the Pritikin label reading guidelines presented in P[employer]ikin's Label Reading Educational series video. Using fool labels brought in from local grocery stores and markets, patients will apply the label reading guidelines and determine if the packaged food meet the Pritikin guidelines. The purpose of this lesson is to provide patients with the opportunity to review, discuss, and practice hands-on learning of the Pritikin Label Reading guidelines with actual packaged food labels. Cooking School  Pritikin's [employer] are designed to teach patients ways to prepare quick, simple, and affordable recipes at home. The importance of nutrition's role in chronic disease risk reduction is reflected in its emphasis in the overall Pritikin program. By learning how to prepare essential core Pritikin Eating Plan recipes, patients will increase control over what they eat; be able to customize the flavor of foods without the use of added salt, sugar, or fat; and improve the quality of the food they consume. By learning a set of core recipes which are easily assembled, quickly prepared, and affordable, patients are more likely to prepare more healthy foods at home. These workshops focus on convenient breakfasts, simple entres, side dishes, and desserts which can be prepared with minimal effort and are consistent with nutrition recommendations for cardiovascular risk reduction. Cooking [employer] are taught by a Armed forces logistics/support/administrative officer (RD) who has been trained by the [employer]. The  chef or RD has a clear understanding of the importance of minimizing - if not completely eliminating - added fat, sugar, and sodium in recipes. Throughout the series of Cooking School Workshop sessions, patients will learn about healthy ingredients and efficient methods of cooking to build confidence in their capability to prepare    Cooking School weekly topics:  Adding Flavor- Sodium-Free  Fast and Healthy Breakfasts  Powerhouse Plant-Based Proteins  Satisfying Salads and Dressings  Simple Sides and Sauces  International Cuisine-Spotlight on the [employer] Zones  Delicious Desserts  Savory Soups  [employer] - Meals in a Astronomer Appetizers and Snacks  Comforting Weekend Breakfasts  One-Pot Wonders   Fast Evening Meals  Landscape architect Your Pritikin Plate  WORKSHOPS   Healthy Mindset (Psychosocial):  Focused Goals, Sustainable Changes Clinical staff led group instruction and group discussion with PowerPoint presentation and patient guidebook. To enhance the learning environment the use of posters, models and videos may be added. Patients will be able to apply effective goal setting strategies to establish at least one personal goal, and then take consistent, meaningful action toward that goal. They will learn to identify common barriers to achieving personal goals and develop strategies to overcome them. Patients will also gain an understanding of how our mind-set can impact our ability to achieve goals and the importance of cultivating a positive and growth-oriented mind-set. The purpose of this lesson is to provide patients with a deeper understanding of how to set and achieve personal goals, as well as the tools and strategies needed to overcome common obstacles which may arise along the way.  From Head to Heart: The Power of a Healthy Outlook  Clinical staff led group  instruction and group discussion with PowerPoint presentation and patient guidebook. To  enhance the learning environment the use of posters, models and videos may be added. Patients will be able to recognize and describe the impact of emotions and mood on physical health. They will discover the importance of self-care and explore self-care practices which may work for them. Patients will also learn how to utilize the 4 C's to cultivate a healthier outlook and better manage stress and challenges. The purpose of this lesson is to demonstrate to patients how a healthy outlook is an essential part of maintaining good health, especially as they continue their cardiac rehab journey.  Healthy Sleep for a Healthy Heart Clinical staff led group instruction and group discussion with PowerPoint presentation and patient guidebook. To enhance the learning environment the use of posters, models and videos may be added. At the conclusion of this workshop, patients will be able to demonstrate knowledge of the importance of sleep to overall health, well-being, and quality of life. They will understand the symptoms of, and treatments for, common sleep disorders. Patients will also be able to identify daytime and nighttime behaviors which impact sleep, and they will be able to apply these tools to help manage sleep-related challenges. The purpose of this lesson is to provide patients with a general overview of sleep and outline the importance of quality sleep. Patients will learn about a few of the most common sleep disorders. Patients will also be introduced to the concept of "sleep hygiene," and discover ways to self-manage certain sleeping problems through simple daily behavior changes. Finally, the workshop will motivate patients by clarifying the links between quality sleep and their goals of heart-healthy living.   Recognizing and Reducing Stress Clinical staff led group instruction and group discussion with PowerPoint presentation and patient guidebook. To enhance the learning environment the use of posters,  models and videos may be added. At the conclusion of this workshop, patients will be able to understand the types of stress reactions, differentiate between acute and chronic stress, and recognize the impact that chronic stress has on their health. They will also be able to apply different coping mechanisms, such as reframing negative self-talk. Patients will have the opportunity to practice a variety of stress management techniques, such as deep abdominal breathing, progressive muscle relaxation, and/or guided imagery.  The purpose of this lesson is to educate patients on the role of stress in their lives and to provide healthy techniques for coping with it.  Learning Barriers/Preferences:  Learning Barriers/Preferences - 07/21/23 1502       Learning Barriers/Preferences   Learning Barriers None    Learning Preferences Audio;Computer/Internet;Group Instruction;Individual Instruction;Pictoral;Skilled Demonstration;Verbal Instruction;Video;Written Material          Education Topics:  Knowledge Questionnaire Score:  Knowledge Questionnaire Score - 07/21/23 1503       Knowledge Questionnaire Score   Pre Score 20/24          Core Components/Risk Factors/Patient Goals at Admission:  Personal Goals and Risk Factors at Admission - 07/21/23 1504       Core Components/Risk Factors/Patient Goals on Admission   Improve shortness of breath with ADL's Yes    Intervention Provide education, individualized exercise plan and daily activity instruction to help decrease symptoms of SOB with activities of daily living.    Expected Outcomes Short Term: Improve cardiorespiratory fitness to achieve a reduction of symptoms when performing ADLs;Long Term: Be able to perform more ADLs without symptoms or delay the onset of symptoms  Lipids Yes    Intervention Provide education and support for participant on nutrition & aerobic/resistive exercise along with prescribed medications to achieve LDL 70mg ,  HDL >40mg .    Expected Outcomes Short Term: Participant states understanding of desired cholesterol values and is compliant with medications prescribed. Participant is following exercise prescription and nutrition guidelines.;Long Term: Cholesterol controlled with medications as prescribed, with individualized exercise RX and with personalized nutrition plan. Value goals: LDL < 70mg , HDL > 40 mg.    Stress Yes    Intervention Offer individual and/or small group education and counseling on adjustment to heart disease, stress management and health-related lifestyle change. Teach and support self-help strategies.;Refer participants experiencing significant psychosocial distress to appropriate mental health specialists for further evaluation and treatment. When possible, include family members and significant others in education/counseling sessions.    Expected Outcomes Short Term: Participant demonstrates changes in health-related behavior, relaxation and other stress management skills, ability to obtain effective social support, and compliance with psychotropic medications if prescribed.;Long Term: Emotional wellbeing is indicated by absence of clinically significant psychosocial distress or social isolation.          Core Components/Risk Factors/Patient Goals Review:   Goals and Risk Factor Review     Row Name 08/02/23 0828 08/16/23 1506           Core Components/Risk Factors/Patient Goals Review   Personal Goals Review Weight Management/Obesity;Improve shortness of breath with ADL's;Stress;Lipids Weight Management/Obesity;Improve shortness of breath with ADL's;Stress;Lipids      Review Mischa started cardiac rehab on 07/25/23. Reeva did well on her first day of exercise. Vtial signs were stable. Latash started cardiac rehab on 07/25/23. Yanett has been cleared to return to exercise by her PCP at [facility]      Expected Outcomes Lanetta will continue to participate in cardiac rehab for exercise,  nutrition and lifestyle modificaitons Valari will continue to participate in cardiac rehab for exercise, nutrition and lifestyle modificaitons         Core Components/Risk Factors/Patient Goals at Discharge (Final Review):   Goals and Risk Factor Review - 08/16/23 1506       Core Components/Risk Factors/Patient Goals Review   Personal Goals Review Weight Management/Obesity;Improve shortness of breath with ADL's;Stress;Lipids    Review Ruble started cardiac rehab on 07/25/23. Jamesia has been cleared to return to exercise by her PCP at [facility]    Expected Outcomes Aarvi will continue to participate in cardiac rehab for exercise, nutrition and lifestyle modificaitons          ITP Comments:  ITP Comments     Row Name 07/21/23 1145 08/02/23 0804 08/16/23 1503       ITP Comments Wilbert Bihari, MD: Medical Director.  Introduction to the [employer] / Intensive Cardiac Rehab.  Initial orientation packet reviewed with the patient. 30 Day ITP Review.   Kissie started cardiac rehab on 07/25/23. Yomayra is off to a good start to exercise 30 Day ITP Review.   Lorieann started cardiac rehab on 07/25/23. Gladiola has been cleared to return to exercise by her PCP at [facility].        Comments: See ITP comments.[name] RN BSN

## 2023-08-17 ENCOUNTER — Encounter (HOSPITAL_COMMUNITY): Admission: RE | Admit: 2023-08-17 | Source: Ambulatory Visit

## 2023-08-19 ENCOUNTER — Encounter (HOSPITAL_COMMUNITY)

## 2023-08-21 ENCOUNTER — Encounter (HOSPITAL_COMMUNITY): Payer: Self-pay | Admitting: *Deleted

## 2023-08-21 ENCOUNTER — Emergency Department (HOSPITAL_COMMUNITY)
Admission: EM | Admit: 2023-08-21 | Discharge: 2023-08-21 | Discharge status: Against Medical Advice | Attending: Emergency Medicine | Admitting: Emergency Medicine

## 2023-08-21 ENCOUNTER — Other Ambulatory Visit: Payer: Self-pay

## 2023-08-21 DIAGNOSIS — I10 Essential (primary) hypertension: Secondary | ICD-10-CM | POA: Diagnosis present

## 2023-08-21 DIAGNOSIS — Z5321 Procedure and treatment not carried out due to patient leaving prior to being seen by health care provider: Secondary | ICD-10-CM | POA: Insufficient documentation

## 2023-08-21 NOTE — ED Notes (Signed)
 Pt decided to leave while waiting for a room.

## 2023-08-21 NOTE — ED Triage Notes (Signed)
 High bp today  as she sits watching a show on her cell phone not looking up

## 2023-08-22 ENCOUNTER — Encounter (HOSPITAL_COMMUNITY)
Admission: RE | Admit: 2023-08-22 | Discharge: 2023-08-22 | Disposition: A | Discharge status: Home / Self Care | Source: Ambulatory Visit | Attending: Cardiology | Admitting: Cardiology

## 2023-08-22 DIAGNOSIS — Z9889 Other specified postprocedural states: Secondary | ICD-10-CM | POA: Diagnosis not present

## 2023-08-24 ENCOUNTER — Encounter (HOSPITAL_COMMUNITY)
Admission: RE | Admit: 2023-08-24 | Discharge: 2023-08-24 | Disposition: A | Discharge status: Home / Self Care | Source: Ambulatory Visit | Attending: Cardiology | Admitting: Cardiology

## 2023-08-24 DIAGNOSIS — Z9889 Other specified postprocedural states: Secondary | ICD-10-CM | POA: Diagnosis not present

## 2023-08-26 ENCOUNTER — Encounter (HOSPITAL_COMMUNITY)

## 2023-08-29 ENCOUNTER — Encounter (HOSPITAL_COMMUNITY): Admission: RE | Admit: 2023-08-29 | Source: Ambulatory Visit

## 2023-08-31 ENCOUNTER — Encounter (HOSPITAL_COMMUNITY)
Admission: RE | Admit: 2023-08-31 | Discharge: 2023-08-31 | Disposition: A | Discharge status: Home / Self Care | Source: Ambulatory Visit | Attending: Cardiology | Admitting: Cardiology

## 2023-08-31 DIAGNOSIS — Z9889 Other specified postprocedural states: Secondary | ICD-10-CM

## 2023-09-02 ENCOUNTER — Encounter (HOSPITAL_COMMUNITY)

## 2023-09-02 ENCOUNTER — Encounter (HOSPITAL_COMMUNITY)
Admission: RE | Admit: 2023-09-02 | Discharge: 2023-09-02 | Disposition: A | Discharge status: Home / Self Care | Source: Ambulatory Visit | Attending: Cardiology | Admitting: Cardiology

## 2023-09-02 DIAGNOSIS — Z48812 Encounter for surgical aftercare following surgery on the circulatory system: Secondary | ICD-10-CM | POA: Insufficient documentation

## 2023-09-02 DIAGNOSIS — Z9889 Other specified postprocedural states: Secondary | ICD-10-CM | POA: Diagnosis present

## 2023-09-02 DIAGNOSIS — Z952 Presence of prosthetic heart valve: Secondary | ICD-10-CM | POA: Diagnosis not present

## 2023-09-05 ENCOUNTER — Encounter (HOSPITAL_COMMUNITY)
Admission: RE | Admit: 2023-09-05 | Discharge: 2023-09-05 | Disposition: A | Discharge status: Home / Self Care | Source: Ambulatory Visit | Attending: Cardiology | Admitting: Cardiology

## 2023-09-05 DIAGNOSIS — Z9889 Other specified postprocedural states: Secondary | ICD-10-CM

## 2023-09-05 DIAGNOSIS — Z48812 Encounter for surgical aftercare following surgery on the circulatory system: Secondary | ICD-10-CM | POA: Diagnosis not present

## 2023-09-07 ENCOUNTER — Encounter (HOSPITAL_COMMUNITY)

## 2023-09-09 ENCOUNTER — Encounter (HOSPITAL_COMMUNITY)

## 2023-09-09 ENCOUNTER — Encounter (HOSPITAL_COMMUNITY)
Admission: RE | Admit: 2023-09-09 | Discharge: 2023-09-09 | Disposition: A | Discharge status: Home / Self Care | Source: Ambulatory Visit | Attending: Cardiology | Admitting: Cardiology

## 2023-09-09 DIAGNOSIS — Z48812 Encounter for surgical aftercare following surgery on the circulatory system: Secondary | ICD-10-CM | POA: Diagnosis not present

## 2023-09-09 DIAGNOSIS — Z9889 Other specified postprocedural states: Secondary | ICD-10-CM

## 2023-09-12 ENCOUNTER — Encounter (HOSPITAL_COMMUNITY)
Admission: RE | Admit: 2023-09-12 | Discharge: 2023-09-12 | Disposition: A | Discharge status: Home / Self Care | Source: Ambulatory Visit | Attending: Cardiology | Admitting: Cardiology

## 2023-09-12 DIAGNOSIS — Z9889 Other specified postprocedural states: Secondary | ICD-10-CM

## 2023-09-12 DIAGNOSIS — Z48812 Encounter for surgical aftercare following surgery on the circulatory system: Secondary | ICD-10-CM | POA: Diagnosis not present

## 2023-09-12 NOTE — Progress Notes (Signed)
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 interests or hobbies;Financial;Unable to perform yard/household activities;Transportation     Comments Will continue to monitor the patient and provide support as needed. Will continue to monitor the patient and provide support as needed. Will continue to monitor the patient and provide support as needed.        Psychosocial Discharge (Final Psychosocial Re-Evaluation):  Psychosocial Re-Evaluation - 09/12/23 1751       Psychosocial Re-Evaluation   Current issues with Current Depression;History of Depression;Current Stress Concerns    Comments Reviewed PHQ9 and quality of life. Gwendolyn Gray says things are getting better. Gwendolyn Gray now has medicaid and has been able to et rides to her doctor's appointments. Gwendolyn Gray now can get food stamps. Gwendolyn Gray says she is still experiencing some shortness of breath but has  noticed some improvement.    Expected Outcomes Gwendolyn Gray will have controlled or decreased deprression upon completion of cardiac rehab    Interventions Stress management education;Encouraged to attend Cardiac Rehabilitation for the exercise;Relaxation education    Continue Psychosocial Services  Follow up required by staff  Initial Review   Source of Stress Concerns Chronic Illness;Unable to participate in former interests or hobbies;Financial;Unable to perform yard/household activities;Transportation    Comments Will continue to monitor the patient and provide support as needed.          Vocational Rehabilitation: Provide vocational rehab assistance to qualifying candidates.   Vocational Rehab Evaluation & Intervention:  Vocational Rehab - 07/21/23 1430       Initial Vocational Rehab Evaluation & Intervention   Assessment shows need for Vocational Rehabilitation No   Gwendolyn Gray is retired but works part time at the at [employer]. Gwendolyn Gray would like to return to work when she is able. Gwendolyn Gray is not interested in Vocational Rehab at this time         Education: Education Goals: Education classes will be provided on a weekly basis, covering required topics. Participant will state understanding/return demonstration of topics presented.    Education     Row Name 07/25/23 1500     Education   Cardiac Education Topics Pritikin   Psychologist, forensic Exercise Education   Exercise Education Biomechanial Limitations   Instruction Review Code 1- Verbalizes Understanding   Class Start Time 1415   Class Stop Time 1450   Class Time Calculation (min) 35 min    Row Name 08/03/23 1400     Education   Cardiac Education Topics Pritikin   Customer service manager   Weekly Topic International Cuisine- Spotlight on the Blue Zones   Instruction Review Code 1- Verbalizes Understanding    Class Start Time 1355   Class Stop Time 1435   Class Time Calculation (min) 40 min    Row Name 08/22/23 1400     Education   Cardiac Education Topics Pritikin   Geographical information systems officer Psychosocial   Psychosocial Workshop From Head to Heart: The Power of a Healthy Outlook   Instruction Review Code 1- Verbalizes Understanding   Class Start Time 1400   Class Stop Time 1445   Class Time Calculation (min) 45 min    Row Name 08/24/23 1500     Education   Cardiac Education Topics Pritikin   Customer service manager   Weekly Topic Tasty Appetizers and Snacks   Instruction Review Code 1- Verbalizes Understanding   Class Start Time 1400   Class Stop Time 1440   Class Time Calculation (min) 40 min    Row Name 08/31/23 1300     Education   Cardiac Education Topics Pritikin   Customer service manager   Weekly Topic Adding Flavor - Sodium-Free   Instruction Review Code 1- Verbalizes Understanding   Class Start Time 1345   Class Stop Time 1435   Class Time Calculation (min) 50 min    Row Name 09/02/23 1300     Education   Cardiac Education Topics Pritikin   [employer] Workshops     Workshops   Educator Exercise Physiologist   Select Exercise   Exercise Workshop Location manager and Fall Prevention   Instruction Review Code 1- Verbalizes Understanding   Class Start Time 1402   Class Stop Time 1441   Class Time Calculation (min) 39 min    Row Name 09/05/23  1500     Education   Cardiac Education Topics Pritikin   Glass blower/designer Nutrition   Nutrition Workshop Label Reading   Instruction Review Code 1- Verbalizes Understanding   Class Start Time 1400   Class Stop Time 1450   Class Time Calculation (min) 50 min    Row Name 09/09/23 1400     Education   Cardiac Education Topics Pritikin   Mudlogger Nutrition   Nutrition Other   Instruction Review Code 1- Verbalizes Understanding   Class Start Time 1400   Class Stop Time 1445   Class Time Calculation (min) 45 min    Row Name 09/12/23 1500     Education   Cardiac Education Topics Pritikin   Hospital doctor Education   General Education Metabolic Syndrome and Belly Fat   Instruction Review Code 1- Verbalizes Understanding   Class Start Time 1403   Class Stop Time 1445   Class Time Calculation (min) 42 min      Core Videos: Exercise    Move It!  Clinical staff conducted group or individual video education with verbal and written material and guidebook.  Patient learns the recommended Pritikin exercise program. Exercise with the goal of living a long, healthy life. Some of the health benefits of exercise include controlled diabetes, healthier blood pressure levels, improved cholesterol levels, improved heart and lung capacity, improved sleep, and better body composition. Everyone should speak with their doctor before starting or changing an exercise routine.  Biomechanical Limitations Clinical staff conducted group or individual video education with verbal and written material and guidebook.  Patient learns how biomechanical limitations can impact exercise and how we can mitigate and possibly overcome limitations to have an impactful and balanced exercise routine.  Body Composition Clinical staff conducted group or individual video education with verbal and written material and guidebook.  Patient learns that body composition (ratio of muscle mass to fat mass) is a key component to assessing overall fitness, rather than body weight alone. Increased fat mass, especially visceral belly fat, can put us  at increased risk for metabolic syndrome, type 2 diabetes, heart disease, and even death. It is  recommended to combine diet and exercise (cardiovascular and resistance training) to improve your body composition. Seek guidance from your physician and exercise physiologist before implementing an exercise routine.  Exercise Action Plan Clinical staff conducted group or individual video education with verbal and written material and guidebook.  Patient learns the recommended strategies to achieve and enjoy long-term exercise adherence, including variety, self-motivation, self-efficacy, and positive decision making. Benefits of exercise include fitness, good health, weight management, more energy, better sleep, less stress, and overall well-being.  Medical   Heart Disease Risk Reduction Clinical staff conducted group or individual video education with verbal and written material and guidebook.  Patient learns our heart is our most vital organ as it circulates oxygen, nutrients, white blood cells, and hormones throughout the entire body, and carries waste away. Data supports a plant-based eating plan like the Pritikin Program for its effectiveness in slowing progression of and reversing heart disease. The video provides a number of recommendations to address heart disease.   Metabolic Syndrome and Belly Fat  Clinical staff conducted group or individual video education with verbal and  written material and guidebook.  Patient learns what metabolic syndrome is, how it leads to heart disease, and how one can reverse it and keep it from coming back. You have metabolic syndrome if you have 3 of the following 5 criteria: abdominal obesity, high blood pressure, high triglycerides, low HDL cholesterol, and high blood sugar.  Hypertension and Heart Disease Clinical staff conducted group or individual video education with verbal and written material and guidebook.  Patient learns that high blood pressure, or hypertension, is very common in the [place] . Hypertension is largely due to excessive salt  intake, but other important risk factors include being overweight, physical inactivity, drinking too much alcohol, smoking, and not eating enough potassium from fruits and vegetables. High blood pressure is a leading risk factor for heart attack, stroke, congestive heart failure, dementia, kidney failure, and premature death. Long-term effects of excessive salt intake include stiffening of the arteries and thickening of heart muscle and organ damage. Recommendations include ways to reduce hypertension and the risk of heart disease.  Diseases of Our Time - Focusing on Diabetes Clinical staff conducted group or individual video education with verbal and written material and guidebook.  Patient learns why the best way to stop diseases of our time is prevention, through food and other lifestyle changes. Medicine (such as prescription pills and surgeries) is often only a Band-Aid on the problem, not a long-term solution. Most common diseases of our time include obesity, type 2 diabetes, hypertension, heart disease, and cancer. The Pritikin Program is recommended and has been proven to help reduce, reverse, and/or prevent the damaging effects of metabolic syndrome.  Nutrition   Overview of the Pritikin Eating Plan  Clinical staff conducted group or individual video education with verbal and written material and guidebook.  Patient learns about the Pritikin Eating Plan for disease risk reduction. The Pritikin Eating Plan emphasizes a wide variety of unrefined, minimally-processed carbohydrates, like fruits, vegetables, whole grains, and legumes. Go, Caution, and Stop food choices are explained. Plant-based and lean animal proteins are emphasized. Rationale provided for low sodium intake for blood pressure control, low added sugars for blood sugar stabilization, and low added fats and oils for coronary artery disease risk reduction and weight management.  Calorie Density  Clinical staff conducted group or  individual video education with verbal and written material and guidebook.  Patient learns about calorie density and how it impacts the Pritikin Eating Plan. Knowing the characteristics of the food you choose will help you decide whether those foods will lead to weight gain or weight loss, and whether you want to consume more or less of them. Weight loss is usually a side effect of the Pritikin Eating Plan because of its focus on low calorie-dense foods.  Label Reading  Clinical staff conducted group or individual video education with verbal and written material and guidebook.  Patient learns about the Pritikin recommended label reading guidelines and corresponding recommendations regarding calorie density, added sugars, sodium content, and whole grains.  Dining Out - Part 1  Clinical staff conducted group or individual video education with verbal and written material and guidebook.  Patient learns that restaurant meals can be sabotaging because they can be so high in calories, fat, sodium, and/or sugar. Patient learns recommended strategies on how to positively address this and avoid unhealthy pitfalls.  Facts on Fats  Clinical staff conducted group or individual video education with verbal and written material and guidebook.  Patient learns that lifestyle modifications can be just as effective,  if not more so, as many medications for lowering your risk of heart disease. A Pritikin lifestyle can help to reduce your risk of inflammation and atherosclerosis (cholesterol build-up, or plaque, in the artery walls). Lifestyle interventions such as dietary choices and physical activity address the cause of atherosclerosis. A review of the types of fats and their impact on blood cholesterol levels, along with dietary recommendations to reduce fat intake is also included.  Nutrition Action Plan  Clinical staff conducted group or individual video education with verbal and written material and guidebook.   Patient learns how to incorporate Pritikin recommendations into their lifestyle. Recommendations include planning and keeping personal health goals in mind as an important part of their success.  Healthy Mind-Set    Healthy Minds, Bodies, Hearts  Clinical staff conducted group or individual video education with verbal and written material and guidebook.  Patient learns how to identify when they are stressed. Video will discuss the impact of that stress, as well as the many benefits of stress management. Patient will also be introduced to stress management techniques. The way we think, act, and feel has an impact on our hearts.  How Our Thoughts Can Heal Our Hearts  Clinical staff conducted group or individual video education with verbal and written material and guidebook.  Patient learns that negative thoughts can cause depression and anxiety. This can result in negative lifestyle behavior and serious health problems. Cognitive behavioral therapy is an effective method to help control our thoughts in order to change and improve our emotional outlook.  Additional Videos:  Exercise    Improving Performance  Clinical staff conducted group or individual video education with verbal and written material and guidebook.  Patient learns to use a non-linear approach by alternating intensity levels and lengths of time spent exercising to help burn more calories and lose more body fat. Cardiovascular exercise helps improve heart health, metabolism, hormonal balance, blood sugar control, and recovery from fatigue. Resistance training improves strength, endurance, balance, coordination, reaction time, metabolism, and muscle mass. Flexibility exercise improves circulation, posture, and balance. Seek guidance from your physician and exercise physiologist before implementing an exercise routine and learn your capabilities and proper form for all exercise.  Introduction to Yoga  Clinical staff conducted group or  individual video education with verbal and written material and guidebook.  Patient learns about yoga, a discipline of the coming together of mind, breath, and body. The benefits of yoga include improved flexibility, improved range of motion, better posture and core strength, increased lung function, weight loss, and positive self-image. Yoga's heart health benefits include lowered blood pressure, healthier heart rate, decreased cholesterol and triglyceride levels, improved immune function, and reduced stress. Seek guidance from your physician and exercise physiologist before implementing an exercise routine and learn your capabilities and proper form for all exercise.  Medical   Aging: Enhancing Your Quality of Life  Clinical staff conducted group or individual video education with verbal and written material and guidebook.  Patient learns key strategies and recommendations to stay in good physical health and enhance quality of life, such as prevention strategies, having an advocate, securing a Health Care Proxy and Power of Attorney, and keeping a list of medications and system for tracking them. It also discusses how to avoid risk for bone loss.  Biology of Weight Control  Clinical staff conducted group or individual video education with verbal and written material and guidebook.  Patient learns that weight gain occurs because we consume more calories than we  burn (eating more, moving less). Even if your body weight is normal, you may have higher ratios of fat compared to muscle mass. Too much body fat puts you at increased risk for cardiovascular disease, heart attack, stroke, type 2 diabetes, and obesity-related cancers. In addition to exercise, following the Pritikin Eating Plan can help reduce your risk.  Decoding Lab Results  Clinical staff conducted group or individual video education with verbal and written material and guidebook.  Patient learns that lab test reflects one measurement whose  values change over time and are influenced by many factors, including medication, stress, sleep, exercise, food, hydration, pre-existing medical conditions, and more. It is recommended to use the knowledge from this video to become more involved with your lab results and evaluate your numbers to speak with your doctor.   Diseases of Our Time - Overview  Clinical staff conducted group or individual video education with verbal and written material and guidebook.  Patient learns that according to the CDC, 50% to 70% of chronic diseases (such as obesity, type 2 diabetes, elevated lipids, hypertension, and heart disease) are avoidable through lifestyle improvements including healthier food choices, listening to satiety cues, and increased physical activity.  Sleep Disorders Clinical staff conducted group or individual video education with verbal and written material and guidebook.  Patient learns how good quality and duration of sleep are important to overall health and well-being. Patient also learns about sleep disorders and how they impact health along with recommendations to address them, including discussing with a physician.  Nutrition  Dining Out - Part 2 Clinical staff conducted group or individual video education with verbal and written material and guidebook.  Patient learns how to plan ahead and communicate in order to maximize their dining experience in a healthy and nutritious manner. Included are recommended food choices based on the type of restaurant the patient is visiting.   Fueling a Banker conducted group or individual video education with verbal and written material and guidebook.  There is a strong connection between our food choices and our health. Diseases like obesity and type 2 diabetes are very prevalent and are in large-part due to lifestyle choices. The Pritikin Eating Plan provides plenty of food and hunger-curbing satisfaction. It is easy to follow,  affordable, and helps reduce health risks.  Menu Workshop  Clinical staff conducted group or individual video education with verbal and written material and guidebook.  Patient learns that restaurant meals can sabotage health goals because they are often packed with calories, fat, sodium, and sugar. Recommendations include strategies to plan ahead and to communicate with the manager, chef, or server to help order a healthier meal.  Planning Your Eating Strategy  Clinical staff conducted group or individual video education with verbal and written material and guidebook.  Patient learns about the Pritikin Eating Plan and its benefit of reducing the risk of disease. The Pritikin Eating Plan does not focus on calories. Instead, it emphasizes high-quality, nutrient-rich foods. By knowing the characteristics of the foods, we choose, we can determine their calorie density and make informed decisions.  Targeting Your Nutrition Priorities  Clinical staff conducted group or individual video education with verbal and written material and guidebook.  Patient learns that lifestyle habits have a tremendous impact on disease risk and progression. This video provides eating and physical activity recommendations based on your personal health goals, such as reducing LDL cholesterol, losing weight, preventing or controlling type 2 diabetes, and reducing high blood pressure.  Vitamins and Minerals  Clinical staff conducted group or individual video education with verbal and written material and guidebook.  Patient learns different ways to obtain key vitamins and minerals, including through a recommended healthy diet. It is important to discuss all supplements you take with your doctor.   Healthy Mind-Set    Smoking Cessation  Clinical staff conducted group or individual video education with verbal and written material and guidebook.  Patient learns that cigarette smoking and tobacco addiction pose a serious health  risk which affects millions of people. Stopping smoking will significantly reduce the risk of heart disease, lung disease, and many forms of cancer. Recommended strategies for quitting are covered, including working with your doctor to develop a successful plan.  Culinary   Becoming a Set designer conducted group or individual video education with verbal and written material and guidebook.  Patient learns that cooking at home can be healthy, cost-effective, quick, and puts them in control. Keys to cooking healthy recipes will include looking at your recipe, assessing your equipment needs, planning ahead, making it simple, choosing cost-effective seasonal ingredients, and limiting the use of added fats, salts, and sugars.  Cooking - Breakfast and Snacks  Clinical staff conducted group or individual video education with verbal and written material and guidebook.  Patient learns how important breakfast is to satiety and nutrition through the entire day. Recommendations include key foods to eat during breakfast to help stabilize blood sugar levels and to prevent overeating at meals later in the day. Planning ahead is also a key component.  Cooking - Educational psychologist conducted group or individual video education with verbal and written material and guidebook.  Patient learns eating strategies to improve overall health, including an approach to cook more at home. Recommendations include thinking of animal protein as a side on your plate rather than center stage and focusing instead on lower calorie dense options like vegetables, fruits, whole grains, and plant-based proteins, such as beans. Making sauces in large quantities to freeze for later and leaving the skin on your vegetables are also recommended to maximize your experience.  Cooking - Healthy Salads and Dressing Clinical staff conducted group or individual video education with verbal and written material and  guidebook.  Patient learns that vegetables, fruits, whole grains, and legumes are the foundations of the Pritikin Eating Plan. Recommendations include how to incorporate each of these in flavorful and healthy salads, and how to create homemade salad dressings. Proper handling of ingredients is also covered. Cooking - Soups and [employer] - Soups and Desserts Clinical staff conducted group or individual video education with verbal and written material and guidebook.  Patient learns that Pritikin soups and desserts make for easy, nutritious, and delicious snacks and meal components that are low in sodium, fat, sugar, and calorie density, while high in vitamins, minerals, and filling fiber. Recommendations include simple and healthy ideas for soups and desserts.   Overview     The Pritikin Solution Program Overview Clinical staff conducted group or individual video education with verbal and written material and guidebook.  Patient learns that the results of the Pritikin Program have been documented in more than 100 articles published in peer-reviewed journals, and the benefits include reducing risk factors for (and, in some cases, even reversing) high cholesterol, high blood pressure, type 2 diabetes, obesity, and more! An overview of the three key pillars of the Pritikin Program will be covered: eating well, doing regular exercise, and  having a healthy mind-set.  WORKSHOPS  Exercise: Exercise Basics: Building Your Action Plan Clinical staff led group instruction and group discussion with PowerPoint presentation and patient guidebook. To enhance the learning environment the use of posters, models and videos may be added. At the conclusion of this workshop, patients will comprehend the difference between physical activity and exercise, as well as the benefits of incorporating both, into their routine. Patients will understand the FITT (Frequency, Intensity, Time, and Type) principle and how to  use it to build an exercise action plan. In addition, safety concerns and other considerations for exercise and cardiac rehab will be addressed by the presenter. The purpose of this lesson is to promote a comprehensive and effective weekly exercise routine in order to improve patients' overall level of fitness.   Managing Heart Disease: Your Path to a Healthier Heart Clinical staff led group instruction and group discussion with PowerPoint presentation and patient guidebook. To enhance the learning environment the use of posters, models and videos may be added.At the conclusion of this workshop, patients will understand the anatomy and physiology of the heart. Additionally, they will understand how Pritikin's three pillars impact the risk factors, the progression, and the management of heart disease.  The purpose of this lesson is to provide a high-level overview of the heart, heart disease, and how the Pritikin lifestyle positively impacts risk factors.  Exercise Biomechanics Clinical staff led group instruction and group discussion with PowerPoint presentation and patient guidebook. To enhance the learning environment the use of posters, models and videos may be added. Patients will learn how the structural parts of their bodies function and how these functions impact their daily activities, movement, and exercise. Patients will learn how to promote a neutral spine, learn how to manage pain, and identify ways to improve their physical movement in order to promote healthy living. The purpose of this lesson is to expose patients to common physical limitations that impact physical activity. Participants will learn practical ways to adapt and manage aches and pains, and to minimize their effect on regular exercise. Patients will learn how to maintain good posture while sitting, walking, and lifting.  Balance Training and Fall Prevention  Clinical staff led group instruction and group discussion  with PowerPoint presentation and patient guidebook. To enhance the learning environment the use of posters, models and videos may be added. At the conclusion of this workshop, patients will understand the importance of their sensorimotor skills (vision, proprioception, and the vestibular system) in maintaining their ability to balance as they age. Patients will apply a variety of balancing exercises that are appropriate for their current level of function. Patients will understand the common causes for poor balance, possible solutions to these problems, and ways to modify their physical environment in order to minimize their fall risk. The purpose of this lesson is to teach patients about the importance of maintaining balance as they age and ways to minimize their risk of falling.  WORKSHOPS   Nutrition:  Fueling a Ship broker led group instruction and group discussion with PowerPoint presentation and patient guidebook. To enhance the learning environment the use of posters, models and videos may be added. Patients will review the foundational principles of the Pritikin Eating Plan and understand what constitutes a serving size in each of the food groups. Patients will also learn Pritikin-friendly foods that are better choices when away from home and review make-ahead meal and snack options. Calorie density will be reviewed and applied to three nutrition  priorities: weight maintenance, weight loss, and weight gain. The purpose of this lesson is to reinforce (in a group setting) the key concepts around what patients are recommended to eat and how to apply these guidelines when away from home by planning and selecting Pritikin-friendly options. Patients will understand how calorie density may be adjusted for different weight management goals.  Mindful Eating  Clinical staff led group instruction and group discussion with PowerPoint presentation and patient guidebook. To enhance the  learning environment the use of posters, models and videos may be added. Patients will briefly review the concepts of the Pritikin Eating Plan and the importance of low-calorie dense foods. The concept of mindful eating will be introduced as well as the importance of paying attention to internal hunger signals. Triggers for non-hunger eating and techniques for dealing with triggers will be explored. The purpose of this lesson is to provide patients with the opportunity to review the basic principles of the Pritikin Eating Plan, discuss the value of eating mindfully and how to measure internal cues of hunger and fullness using the Hunger Scale. Patients will also discuss reasons for non-hunger eating and learn strategies to use for controlling emotional eating.  Targeting Your Nutrition Priorities Clinical staff led group instruction and group discussion with PowerPoint presentation and patient guidebook. To enhance the learning environment the use of posters, models and videos may be added. Patients will learn how to determine their genetic susceptibility to disease by reviewing their family history. Patients will gain insight into the importance of diet as part of an overall healthy lifestyle in mitigating the impact of genetics and other environmental insults. The purpose of this lesson is to provide patients with the opportunity to assess their personal nutrition priorities by looking at their family history, their own health history and current risk factors. Patients will also be able to discuss ways of prioritizing and modifying the Pritikin Eating Plan for their highest risk areas  Menu  Clinical staff led group instruction and group discussion with PowerPoint presentation and patient guidebook. To enhance the learning environment the use of posters, models and videos may be added. Using menus brought in from [employer], or printed from [employer], patients will apply the Pritikin dining out  guidelines that were presented in the [employer] video. Patients will also be able to practice these guidelines in a variety of provided scenarios. The purpose of this lesson is to provide patients with the opportunity to practice hands-on learning of the Pritikin Dining Out guidelines with actual menus and practice scenarios.  Label Reading Clinical staff led group instruction and group discussion with PowerPoint presentation and patient guidebook. To enhance the learning environment the use of posters, models and videos may be added. Patients will review and discuss the Pritikin label reading guidelines presented in Pritikin's Label Reading Educational series video. Using fool labels brought in from local grocery stores and markets, patients will apply the label reading guidelines and determine if the packaged food meet the Pritikin guidelines. The purpose of this lesson is to provide patients with the opportunity to review, discuss, and practice hands-on learning of the Pritikin Label Reading guidelines with actual packaged food labels. Cooking School  Pritikin's [employer] are designed to teach patients ways to prepare quick, simple, and affordable recipes at home. The importance of nutrition's role in chronic disease risk reduction is reflected in its emphasis in the overall Pritikin program. By learning how to prepare essential core Pritikin Eating Plan recipes,  patients will increase control over what they eat; be able to customize the flavor of foods without the use of added salt, sugar, or fat; and improve the quality of the food they consume. By learning a set of core recipes which are easily assembled, quickly prepared, and affordable, patients are more likely to prepare more healthy foods at home. These workshops focus on convenient breakfasts, simple entres, side dishes, and desserts which can be prepared with minimal effort and are consistent with nutrition  recommendations for cardiovascular risk reduction. Cooking [employer] are taught by a Armed forces logistics/support/administrative officer (RD) who has been trained by the [employer]. The chef or RD has a clear understanding of the importance of minimizing - if not completely eliminating - added fat, sugar, and sodium in recipes. Throughout the series of Cooking School Workshop sessions, patients will learn about healthy ingredients and efficient methods of cooking to build confidence in their capability to prepare    Cooking School weekly topics:  Adding Flavor- Sodium-Free  Fast and Healthy Breakfasts  Powerhouse Plant-Based Proteins  Satisfying Salads and Dressings  Simple Sides and Sauces  International Cuisine-Spotlight on the [employer] Zones  Delicious Desserts  Savory Soups  [employer] - Meals in a Astronomer Appetizers and Snacks  Comforting Weekend Breakfasts  One-Pot Wonders   Fast Evening Meals  Landscape architect Your Pritikin Plate  WORKSHOPS   Healthy Mindset (Psychosocial):  Focused Goals, Sustainable Changes Clinical staff led group instruction and group discussion with PowerPoint presentation and patient guidebook. To enhance the learning environment the use of posters, models and videos may be added. Patients will be able to apply effective goal setting strategies to establish at least one personal goal, and then take consistent, meaningful action toward that goal. They will learn to identify common barriers to achieving personal goals and develop strategies to overcome them. Patients will also gain an understanding of how our mind-set can impact our ability to achieve goals and the importance of cultivating a positive and growth-oriented mind-set. The purpose of this lesson is to provide patients with a deeper understanding of how to set and achieve personal goals, as well as the tools and strategies needed to overcome common obstacles which may arise along  the way.  From Head to Heart: The Power of a Healthy Outlook  Clinical staff led group instruction and group discussion with PowerPoint presentation and patient guidebook. To enhance the learning environment the use of posters, models and videos may be added. Patients will be able to recognize and describe the impact of emotions and mood on physical health. They will discover the importance of self-care and explore self-care practices which may work for them. Patients will also learn how to utilize the 4 C's to cultivate a healthier outlook and better manage stress and challenges. The purpose of this lesson is to demonstrate to patients how a healthy outlook is an essential part of maintaining good health, especially as they continue their cardiac rehab journey.  Healthy Sleep for a Healthy Heart Clinical staff led group instruction and group discussion with PowerPoint presentation and patient guidebook. To enhance the learning environment the use of posters, models and videos may be added. At the conclusion of this workshop, patients will be able to demonstrate knowledge of the importance of sleep to overall health, well-being, and quality of life. They will understand the symptoms of, and treatments for, common sleep disorders. Patients will also be able to identify daytime and  nighttime behaviors which impact sleep, and they will be able to apply these tools to help manage sleep-related challenges. The purpose of this lesson is to provide patients with a general overview of sleep and outline the importance of quality sleep. Patients will learn about a few of the most common sleep disorders. Patients will also be introduced to the concept of "sleep hygiene," and discover ways to self-manage certain sleeping problems through simple daily behavior changes. Finally, the workshop will motivate patients by clarifying the links between quality sleep and their goals of heart-healthy living.   Recognizing and  Reducing Stress Clinical staff led group instruction and group discussion with PowerPoint presentation and patient guidebook. To enhance the learning environment the use of posters, models and videos may be added. At the conclusion of this workshop, patients will be able to understand the types of stress reactions, differentiate between acute and chronic stress, and recognize the impact that chronic stress has on their health. They will also be able to apply different coping mechanisms, such as reframing negative self-talk. Patients will have the opportunity to practice a variety of stress management techniques, such as deep abdominal breathing, progressive muscle relaxation, and/or guided imagery.  The purpose of this lesson is to educate patients on the role of stress in their lives and to provide healthy techniques for coping with it.  Learning Barriers/Preferences:  Learning Barriers/Preferences - 07/21/23 1502       Learning Barriers/Preferences   Learning Barriers None    Learning Preferences Audio;Computer/Internet;Group Instruction;Individual Instruction;Pictoral;Skilled Demonstration;Verbal Instruction;Video;Written Material          Education Topics:  Knowledge Questionnaire Score:  Knowledge Questionnaire Score - 07/21/23 1503       Knowledge Questionnaire Score   Pre Score 20/24          Core Components/Risk Factors/Patient Goals at Admission:  Personal Goals and Risk Factors at Admission - 07/21/23 1504       Core Components/Risk Factors/Patient Goals on Admission   Improve shortness of breath with ADL's Yes    Intervention Provide education, individualized exercise plan and daily activity instruction to help decrease symptoms of SOB with activities of daily living.    Expected Outcomes Short Term: Improve cardiorespiratory fitness to achieve a reduction of symptoms when performing ADLs;Long Term: Be able to perform more ADLs without symptoms or delay the onset of  symptoms    Lipids Yes    Intervention Provide education and support for participant on nutrition & aerobic/resistive exercise along with prescribed medications to achieve LDL 70mg , HDL >40mg .    Expected Outcomes Short Term: Participant states understanding of desired cholesterol values and is compliant with medications prescribed. Participant is following exercise prescription and nutrition guidelines.;Long Term: Cholesterol controlled with medications as prescribed, with individualized exercise RX and with personalized nutrition plan. Value goals: LDL < 70mg , HDL > 40 mg.    Stress Yes    Intervention Offer individual and/or small group education and counseling on adjustment to heart disease, stress management and health-related lifestyle change. Teach and support self-help strategies.;Refer participants experiencing significant psychosocial distress to appropriate mental health specialists for further evaluation and treatment. When possible, include family members and significant others in education/counseling sessions.    Expected Outcomes Short Term: Participant demonstrates changes in health-related behavior, relaxation and other stress management skills, ability to obtain effective social support, and compliance with psychotropic medications if prescribed.;Long Term: Emotional wellbeing is indicated by absence of clinically significant psychosocial distress or social isolation.  Core Components/Risk Factors/Patient Goals Review:   Goals and Risk Factor Review     Row Name 08/02/23 0828 08/16/23 1506 09/12/23 1756         Core Components/Risk Factors/Patient Goals Review   Personal Goals Review Weight Management/Obesity;Improve shortness of breath with ADL's;Stress;Lipids Weight Management/Obesity;Improve shortness of breath with ADL's;Stress;Lipids Weight Management/Obesity;Improve shortness of breath with ADL's;Stress;Lipids     Review Gwendolyn Gray started cardiac rehab on 07/25/23.  Gwendolyn Gray did well on her first day of exercise. Vtial signs were stable. Gwendolyn Gray started cardiac rehab on 07/25/23. Gwendolyn Gray has been cleared to return to exercise by her PCP at [facility] has been doing well with exercise at cardiac rehab since her return to exercise. Vital signs have been stable. Gwendolyn Gray has stopped using her rollator as she says she feels stronger.     Expected Outcomes Janitza will continue to participate in cardiac rehab for exercise, nutrition and lifestyle modificaitons Chelisa will continue to participate in cardiac rehab for exercise, nutrition and lifestyle modificaitons Katora will continue to participate in cardiac rehab for exercise, nutrition and lifestyle modificaitons        Core Components/Risk Factors/Patient Goals at Discharge (Final Review):   Goals and Risk Factor Review - 09/12/23 1756       Core Components/Risk Factors/Patient Goals Review   Personal Goals Review Weight Management/Obesity;Improve shortness of breath with ADL's;Stress;Lipids    Review Nakyia has been doing well with exercise at cardiac rehab since her return to exercise. Vital signs have been stable. Shirly has stopped using her rollator as she says she feels stronger.    Expected Outcomes Nicole will continue to participate in cardiac rehab for exercise, nutrition and lifestyle modificaitons          ITP Comments:  ITP Comments     Row Name 07/21/23 1145 08/02/23 0804 08/16/23 1503 09/12/23 1750     ITP Comments Wilbert Bihari, MD: Medical Director.  Introduction to the [employer] / Intensive Cardiac Rehab.  Initial orientation packet reviewed with the patient. 30 Day ITP Review.   Vaishali started cardiac rehab on 07/25/23. Gray is off to a good start to exercise 30 Day ITP Review.   Estefany started cardiac rehab on 07/25/23. Bexlee has been cleared to return to exercise by her PCP at [facility] 30 Day ITP Review.   Lennox has good participation in  cardiac rehab since  she has returned to exercise.       Comments: See ITP comments.[name] RN BSN

## 2023-09-14 ENCOUNTER — Telehealth (HOSPITAL_COMMUNITY): Payer: Self-pay

## 2023-09-14 ENCOUNTER — Encounter (HOSPITAL_COMMUNITY)
Admission: RE | Admit: 2023-09-14 | Discharge: 2023-09-14 | Disposition: A | Discharge status: Home / Self Care | Source: Ambulatory Visit | Attending: Cardiology | Admitting: Cardiology

## 2023-09-14 DIAGNOSIS — Z9889 Other specified postprocedural states: Secondary | ICD-10-CM

## 2023-09-14 DIAGNOSIS — Z48812 Encounter for surgical aftercare following surgery on the circulatory system: Secondary | ICD-10-CM | POA: Diagnosis not present

## 2023-09-14 NOTE — Telephone Encounter (Signed)
 Patient requested that we changed her schedule from M&W to MWF, and change her grad date to 9/05 instead of 9/10 due to going out of town. Her schedule has been updated.

## 2023-09-16 ENCOUNTER — Encounter (HOSPITAL_COMMUNITY)
Admission: RE | Admit: 2023-09-16 | Discharge: 2023-09-16 | Disposition: A | Discharge status: Home / Self Care | Source: Ambulatory Visit | Attending: Cardiology | Admitting: Cardiology

## 2023-09-16 ENCOUNTER — Encounter (HOSPITAL_COMMUNITY)

## 2023-09-16 DIAGNOSIS — Z48812 Encounter for surgical aftercare following surgery on the circulatory system: Secondary | ICD-10-CM | POA: Diagnosis not present

## 2023-09-16 DIAGNOSIS — Z9889 Other specified postprocedural states: Secondary | ICD-10-CM

## 2023-09-19 ENCOUNTER — Encounter (HOSPITAL_COMMUNITY)
Admission: RE | Admit: 2023-09-19 | Discharge: 2023-09-19 | Disposition: A | Discharge status: Home / Self Care | Source: Ambulatory Visit | Attending: Cardiology | Admitting: Cardiology

## 2023-09-19 DIAGNOSIS — Z48812 Encounter for surgical aftercare following surgery on the circulatory system: Secondary | ICD-10-CM | POA: Diagnosis not present

## 2023-09-19 DIAGNOSIS — Z9889 Other specified postprocedural states: Secondary | ICD-10-CM

## 2023-09-21 ENCOUNTER — Encounter (HOSPITAL_COMMUNITY)
Admission: RE | Admit: 2023-09-21 | Discharge: 2023-09-21 | Disposition: A | Discharge status: Home / Self Care | Source: Ambulatory Visit | Attending: Cardiology | Admitting: Cardiology

## 2023-09-21 DIAGNOSIS — Z48812 Encounter for surgical aftercare following surgery on the circulatory system: Secondary | ICD-10-CM | POA: Diagnosis not present

## 2023-09-21 DIAGNOSIS — Z9889 Other specified postprocedural states: Secondary | ICD-10-CM

## 2023-09-22 NOTE — Progress Notes (Signed)
 CARDIAC REHAB PHASE 2  Reviewed home exercise with pt today. Pt is tolerating exercise well. Pt will continue to exercise on her own by walking, small exercise trampoline, thigh crunch, weights and hula hoop for 30-45 minutes per session 1-2 days a week in addition to the 3 days in [facility]2. Advised pt on THRR, RPE scale, hydration and temperature/humidity precautions. Reinforced NTG use, S/S to stop exercise and when to call MD vs 911. Encouraged warm up cool down and stretches with exercise sessions. Pt verbalized understanding, all questions were answered and pt was given a copy to take home.   Quince Santana S Salbador Fiveash ACSM-CEP

## 2023-09-23 ENCOUNTER — Encounter (HOSPITAL_COMMUNITY)
Admission: RE | Admit: 2023-09-23 | Discharge: 2023-09-23 | Disposition: A | Discharge status: Home / Self Care | Source: Ambulatory Visit | Attending: Cardiology | Admitting: Cardiology

## 2023-09-23 ENCOUNTER — Encounter (HOSPITAL_COMMUNITY)

## 2023-09-23 DIAGNOSIS — Z48812 Encounter for surgical aftercare following surgery on the circulatory system: Secondary | ICD-10-CM | POA: Diagnosis not present

## 2023-09-23 DIAGNOSIS — Z9889 Other specified postprocedural states: Secondary | ICD-10-CM

## 2023-09-26 ENCOUNTER — Encounter (HOSPITAL_COMMUNITY)

## 2023-09-26 ENCOUNTER — Encounter (HOSPITAL_COMMUNITY)
Admission: RE | Admit: 2023-09-26 | Discharge: 2023-09-26 | Disposition: A | Discharge status: Home / Self Care | Source: Ambulatory Visit | Attending: Cardiology | Admitting: Cardiology

## 2023-09-26 DIAGNOSIS — Z9889 Other specified postprocedural states: Secondary | ICD-10-CM

## 2023-09-26 DIAGNOSIS — Z48812 Encounter for surgical aftercare following surgery on the circulatory system: Secondary | ICD-10-CM | POA: Diagnosis not present

## 2023-09-28 ENCOUNTER — Encounter (HOSPITAL_COMMUNITY)

## 2023-09-30 ENCOUNTER — Encounter (HOSPITAL_COMMUNITY)

## 2023-09-30 ENCOUNTER — Inpatient Hospital Stay (HOSPITAL_COMMUNITY): Admission: RE | Admit: 2023-09-30 | Source: Ambulatory Visit

## 2023-09-30 DIAGNOSIS — Z48812 Encounter for surgical aftercare following surgery on the circulatory system: Secondary | ICD-10-CM | POA: Insufficient documentation

## 2023-09-30 DIAGNOSIS — Z952 Presence of prosthetic heart valve: Secondary | ICD-10-CM | POA: Insufficient documentation

## 2023-10-05 ENCOUNTER — Encounter (HOSPITAL_COMMUNITY)
Admission: RE | Admit: 2023-10-05 | Discharge: 2023-10-05 | Disposition: A | Discharge status: Home / Self Care | Source: Ambulatory Visit | Attending: Cardiology | Admitting: Cardiology

## 2023-10-05 ENCOUNTER — Encounter (HOSPITAL_COMMUNITY)

## 2023-10-05 DIAGNOSIS — Z9889 Other specified postprocedural states: Secondary | ICD-10-CM | POA: Diagnosis present

## 2023-10-07 ENCOUNTER — Encounter (HOSPITAL_COMMUNITY)

## 2023-10-07 ENCOUNTER — Encounter (HOSPITAL_COMMUNITY)
Admission: RE | Admit: 2023-10-07 | Discharge: 2023-10-07 | Disposition: A | Discharge status: Home / Self Care | Source: Ambulatory Visit | Attending: Cardiology | Admitting: Cardiology

## 2023-10-07 VITALS — Ht 63.0 in | Wt 165.3 lb

## 2023-10-07 DIAGNOSIS — Z9889 Other specified postprocedural states: Secondary | ICD-10-CM | POA: Diagnosis not present

## 2023-10-07 NOTE — Progress Notes (Signed)
 Discharge Progress Report  Patient Details  Name: Gwendolyn Gray MRN: [id number] Date of Birth: 03-04-1955 Referring Provider:   Flowsheet Row INTENSIVE CARDIAC REHAB ORIENT from 07/21/2023 in [facility]  Referring Provider Dr. Tomi (Turner covering)     Number of Visits: 34  Reason for Discharge:  Patient reached a stable level of exercise. Patient independent in their exercise. Patient has met program and personal goals.  Smoking History:  Social History   Tobacco Use  Smoking Status Never  Smokeless Tobacco Never    Diagnosis:  05/17/23 S/P mitral valve repair at [facility]  ADL UCSD:   Initial Exercise Prescription:  Initial Exercise Prescription - 07/21/23 1500       Date of Initial Exercise RX and Referring Provider   Date 07/21/23    Referring Provider Dr. Tomi Verba covering)    Expected Discharge Date 10/12/23      Recumbant Bike   Level 1    RPM 60    Watts 25    Minutes 15    METs 2.5      NuStep   Level 1    SPM 75    Minutes 15    METs 2.5      Prescription Details   Frequency (times per week) 3    Duration Progress to 30 minutes of continuous aerobic without signs/symptoms of physical distress      Intensity   THRR 40-80% of Max Heartrate 62-123    Ratings of Perceived Exertion 11-13    Perceived Dyspnea 0-4      Progression   Progression Continue progressive overload as per policy without signs/symptoms or physical distress.      Resistance Training   Training Prescription Yes    Weight 2    Reps 10-15          Discharge Exercise Prescription (Final Exercise Prescription Changes):  Exercise Prescription Changes - 10/07/23 1600       Response to Exercise   Blood Pressure (Admit) 138/68    Blood Pressure (Exit) 142/70    Heart Rate (Admit) 82 bpm    Heart Rate (Exercise) 107 bpm    Heart Rate (Exit) 90 bpm    Rating of Perceived  Exertion (Exercise) 9    Perceived Dyspnea (Exercise) 0    Symptoms 0    Comments Pt last day in CRP2 program    Duration Continue with 30 min of aerobic exercise without signs/symptoms of physical distress.    Intensity THRR unchanged      Progression   Progression Continue to progress workloads to maintain intensity without signs/symptoms of physical distress.    Average METs 2.15      Resistance Training   Training Prescription Yes    Weight 2    Reps 10-15    Time 10 Minutes      Interval Training   Interval Training No      Recumbant Bike   Level 3    RPM 51    Watts 25    Minutes 15    METs 2.5      NuStep   Level 4    SPM 68    Minutes 15    METs 1.8      Home Exercise Plan   Plans to continue exercise at Home (comment)    Frequency Add 2 additional days to program exercise sessions.    Initial Home Exercises Provided 09/19/23  Functional Capacity:  6 Minute Walk     Row Name 07/21/23 1505 10/12/23 1312       6 Minute Walk   Phase Initial  Uesd rollator Discharge    Distance 1089 feet 1285 feet    Distance % Change -- 18 %    Distance Feet Change -- 196 ft    Walk Time 6 minutes 6 minutes    # of Rest Breaks 0 0    MPH 2.1 2.43    METS 2.51 2.94    RPE 11 8    Perceived Dyspnea  1 1    VO2 Peak 8.8 10.29    Symptoms Yes (comment) Yes (comment)    Comments Mild SOB, RPD = 1 Insicion pain    Resting HR 81 bpm 79 bpm    Resting BP 126/82 124/80    Resting Oxygen Saturation  98 % --    Exercise Oxygen Saturation  during 6 min walk 95 % --    Max Ex. HR 90 bpm 95 bpm    Max Ex. BP 134/80 140/80    2 Minute Post BP 130/80 --      Interval Oxygen   Interval Oxygen? Yes --    Baseline Oxygen Saturation % 98 % --    1 Minute Oxygen Saturation % 96 % --    1 Minute Liters of Oxygen 0 L --    2 Minute Oxygen Saturation % 95 % --    2 Minute Liters of Oxygen 0 L --    3 Minute Oxygen Saturation % 96 % --    3 Minute Liters of Oxygen 0 L  --    4 Minute Oxygen Saturation % 96 % --    4 Minute Liters of Oxygen 0 L --    5 Minute Oxygen Saturation % 97 % --    5 Minute Liters of Oxygen 0 L --    6 Minute Oxygen Saturation % 95 % --    6 Minute Liters of Oxygen 0 L --    2 Minute Post Oxygen Saturation % 98 % --    2 Minute Post Liters of Oxygen 0 L --       Psychological, QOL, Others - Outcomes: PHQ 2/9:    10/07/2023    4:12 PM 07/21/2023    3:12 PM  Depression screen PHQ 2/9  Decreased Interest 0 1  Down, Depressed, Hopeless 0 1  PHQ - 2 Score 0 2  Altered sleeping 1 3  Tired, decreased energy 1 1  Change in appetite 0 1  Feeling bad or failure about yourself  0 1  Trouble concentrating 0 1  Moving slowly or fidgety/restless 0 1  Suicidal thoughts 0 0  PHQ-9 Score 2 10  Difficult doing work/chores Somewhat difficult Somewhat difficult    Quality of Life:  Quality of Life - 10/07/23 1645       Quality of Life   Select Quality of Life      Quality of Life Scores   Health/Function Pre 14.12 %    Health/Function Post 21.03 %    Health/Function % Change 48.94 %    Socioeconomic Pre 18.29 %    Socioeconomic Post 18.75 %    Socioeconomic % Change  2.52 %    Psych/Spiritual Pre 18.43 %    Psych/Spiritual Post 21.43 %    Psych/Spiritual % Change 16.28 %    Family Pre 18 %    Family Post 22.5 %  Family % Change 25 %    GLOBAL Pre 16.53 %    GLOBAL Post 20.8 %    GLOBAL % Change 25.83 %          Personal Goals: Goals established at orientation with interventions provided to work toward goal.  Personal Goals and Risk Factors at Admission - 07/21/23 1504       Core Components/Risk Factors/Patient Goals on Admission   Improve shortness of breath with ADL's Yes    Intervention Provide education, individualized exercise plan and daily activity instruction to help decrease symptoms of SOB with activities of daily living.    Expected Outcomes Short Term: Improve cardiorespiratory fitness to achieve a  reduction of symptoms when performing ADLs;Long Term: Be able to perform more ADLs without symptoms or delay the onset of symptoms    Lipids Yes    Intervention Provide education and support for participant on nutrition & aerobic/resistive exercise along with prescribed medications to achieve LDL 70mg , HDL >40mg .    Expected Outcomes Short Term: Participant states understanding of desired cholesterol values and is compliant with medications prescribed. Participant is following exercise prescription and nutrition guidelines.;Long Term: Cholesterol controlled with medications as prescribed, with individualized exercise RX and with personalized nutrition plan. Value goals: LDL < 70mg , HDL > 40 mg.    Stress Yes    Intervention Offer individual and/or small group education and counseling on adjustment to heart disease, stress management and health-related lifestyle change. Teach and support self-help strategies.;Refer participants experiencing significant psychosocial distress to appropriate mental health specialists for further evaluation and treatment. When possible, include family members and significant others in education/counseling sessions.    Expected Outcomes Short Term: Participant demonstrates changes in health-related behavior, relaxation and other stress management skills, ability to obtain effective social support, and compliance with psychotropic medications if prescribed.;Long Term: Emotional wellbeing is indicated by absence of clinically significant psychosocial distress or social isolation.           Personal Goals Discharge:  Goals and Risk Factor Review     Row Name 08/02/23 9171 08/16/23 1506 09/12/23 1756 10/13/23 0817       Core Components/Risk Factors/Patient Goals Review   Personal Goals Review Weight Management/Obesity;Improve shortness of breath with ADL's;Stress;Lipids Weight Management/Obesity;Improve shortness of breath with ADL's;Stress;Lipids Weight  Management/Obesity;Improve shortness of breath with ADL's;Stress;Lipids Weight Management/Obesity;Improve shortness of breath with ADL's;Stress;Lipids    Review Deangela started cardiac rehab on 07/25/23. Messiah did well on her first day of exercise. Vtial signs were stable. Demeisha started cardiac rehab on 07/25/23. Kaylise has been cleared to return to exercise by her PCP at [facility] has been doing well with exercise at cardiac rehab since her return to exercise. Vital signs have been stable. Jashanti has stopped using her rollator as she says she feels stronger. Calyse completed exercise at cardiac rehab on 10/07/23.  Vital signs were  stable.    Expected Outcomes Zyana will continue to participate in cardiac rehab for exercise, nutrition and lifestyle modificaitons Mariama will continue to participate in cardiac rehab for exercise, nutrition and lifestyle modificaitons Lamyia will continue to participate in cardiac rehab for exercise, nutrition and lifestyle modificaitons Doloras will continue to exercise,  follow nutrition and lifestyle modificaitons       Exercise Goals and Review:  Exercise Goals     Row Name 07/21/23 1146             Exercise Goals   Increase Physical Activity Yes  Intervention Provide advice, education, support and counseling about physical activity/exercise needs.;Develop an individualized exercise prescription for aerobic and resistive training based on initial evaluation findings, risk stratification, comorbidities and participant's personal goals.       Expected Outcomes Short Term: Attend rehab on a regular basis to increase amount of physical activity.;Long Term: Exercising regularly at least 3-5 days a week.;Long Term: Add in home exercise to make exercise part of routine and to increase amount of physical activity.       Increase Strength and Stamina Yes       Intervention Provide advice, education, support and counseling about physical  activity/exercise needs.;Develop an individualized exercise prescription for aerobic and resistive training based on initial evaluation findings, risk stratification, comorbidities and participant's personal goals.       Expected Outcomes Short Term: Increase workloads from initial exercise prescription for resistance, speed, and METs.;Short Term: Perform resistance training exercises routinely during rehab and add in resistance training at home;Long Term: Improve cardiorespiratory fitness, muscular endurance and strength as measured by increased METs and functional capacity ( )       Able to understand and use rate of perceived exertion (RPE) scale Yes       Intervention Provide education and explanation on how to use RPE scale       Expected Outcomes Short Term: Able to use RPE daily in rehab to express subjective intensity level;Long Term:  Able to use RPE to guide intensity level when exercising independently       Knowledge and understanding of Target Heart Rate Range (THRR) Yes       Intervention Provide education and explanation of THRR including how the numbers were predicted and where they are located for reference       Expected Outcomes Short Term: Able to state/look up THRR;Long Term: Able to use THRR to govern intensity when exercising independently;Short Term: Able to use daily as guideline for intensity in rehab       Understanding of Exercise Prescription Yes       Intervention Provide education, explanation, and written materials on patient's individual exercise prescription       Expected Outcomes Short Term: Able to explain program exercise prescription;Long Term: Able to explain home exercise prescription to exercise independently          Exercise Goals Re-Evaluation:  Exercise Goals Re-Evaluation     Row Name 07/25/23 1635 09/05/23 1630 10/07/23 1631         Exercise Goal Re-Evaluation   Exercise Goals Review Increase Physical Activity;Understanding of Exercise  Prescription;Increase Strength and Stamina;Knowledge and understanding of Target Heart Rate Range (THRR);Able to understand and use rate of perceived exertion (RPE) scale Increase Physical Activity;Understanding of Exercise Prescription;Increase Strength and Stamina;Knowledge and understanding of Target Heart Rate Range (THRR);Able to understand and use rate of perceived exertion (RPE) scale Increase Physical Activity;Increase Strength and Stamina;Able to understand and use rate of perceived exertion (RPE) scale;Knowledge and understanding of Target Heart Rate Range (THRR);Understanding of Exercise Prescription     Comments Pt first day in teh [facility] ICR program. Pt tolerated exercise well with an average MET level of 2.1. Pt is off to a good start and is learning her THRR, RPE and ExRx Reviewed MET's and goals. Pt tolerated exercise well with an average MET level of 1.85. Pt is doing well with attendance now and is increase strength and stamina. She also has a more optimistic outlook on her health and is feeling good with exercise. Will work on  gradual progression over time. Pt last day in CRP2 program. Pt completed 17 exercise sessions with a peak MET level of 2.8. Pt is planning to exercise at home by walking, using stretch bands, and apartment complex gym equipment 30-45 min/day  3-4 days/week. Pt showed improvement on her and her strength/stamina while in program.     Expected Outcomes Will continue to monitor pt and progess workloads as tolerated without sign or symptom Will continue to monitor pt and progess workloads as tolerated without sign or symptom Pt will continue to exercise on her own following guidelines learned in CRP2.        Nutrition & Weight - Outcomes:  Pre Biometrics - 07/21/23 1015       Pre Biometrics   Waist Circumference 37.5 inches    Hip Circumference 43 inches    Waist to Hip Ratio 0.87 %    Triceps Skinfold 38 mm    % Body Fat 42.4 %    Grip Strength 17 kg     Flexibility 14.5 in    Single Leg Stand 30 seconds          Post Biometrics - 10/12/23 1319        Post  Biometrics   Height 5' 3 (1.6 m)    Weight 75 kg    Waist Circumference 34.25 inches    Hip Circumference 41 inches    Waist to Hip Ratio 0.84 %    BMI (Calculated) 29.3    Triceps Skinfold 35 mm    % Body Fat 40.8 %    Grip Strength 16 kg    Flexibility 12 in    Single Leg Stand 30 seconds          Nutrition:  Nutrition Therapy & Goals - 09/26/23 1528       Nutrition Therapy   Diet Heart Healthy Diet    Drug/Food Interactions Statins/Certain Fruits      Personal Nutrition Goals   Nutrition Goal Patient to identify strategies for reducing cardiovascular risk by attending the [facility] education and nutrition series weekly.   goal in progress   Personal Goal #2 Patient to improve diet quality by using the plate method as a guide for meal planning to include lean protein/plant protein, fruits, vegetables, whole grains, nonfat dairy as part of a well-balanced diet.   goal in progress.   Comments Goals in progress. 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 Lipids are well controlled at goal. A1c is in a prediabetic range. She recently qualified for [facility] and this has improved food insecurity. She is up 1.8# since starting with our program. Patient will benefit from participation in intensive cardiac rehab for nutrition, exercise, and lifestyle modification.      Intervention Plan   Intervention Prescribe, educate and counsel regarding individualized specific dietary modifications aiming towards targeted core components such as weight, hypertension, lipid management, diabetes, heart failure and other comorbidities.;Nutrition handout(s) given to patient.    Expected Outcomes Short Term Goal: Understand basic principles of dietary content, such as calories, fat, sodium, cholesterol and nutrients.;Long  Term Goal: Adherence to prescribed nutrition plan.          Nutrition Discharge:  Nutrition Assessments - 10/07/23 1644       Rate Your Plate Scores   Pre Score 56          Education Questionnaire Score:  Knowledge Questionnaire Score - 10/07/23 1645  Knowledge Questionnaire Score   Pre Score 20/24    Post Score 19/24          Goals reviewed with patient; copy given to patient. Pt graduates from  Intensive/Traditional cardiac rehab program on 10/07/23  with completion of  34 exercise and education sessions. Pt maintained good attendance and progressed nicely during their participation in rehab as evidenced by increased MET level. Marilynn increased her distance on her post exercise wlak test by 196 feet.   Medication list reconciled. Repeat  PHQ score- 2.  Pt has made significant lifestyle changes and should be commended for their success.   achieved their goals during cardiac rehab.   Pt plans to continue exercise at the workout facility at her gym at her apartment complex after going to [place]  to visit family.  Cheyne says she feels a lot better since participating in cardiac rehab. Mariabelen now has medicaid and has access to transportation to get to her doctor's appointments. We are proud of Aja's progress.[name] RN BSN

## 2023-10-10 ENCOUNTER — Encounter (HOSPITAL_COMMUNITY)

## 2023-10-12 ENCOUNTER — Encounter (HOSPITAL_COMMUNITY)

## 2023-12-16 ENCOUNTER — Ambulatory Visit (HOSPITAL_COMMUNITY)
Admission: EM | Admit: 2023-12-16 | Discharge: 2023-12-16 | Disposition: A | Discharge status: Home / Self Care | Attending: Physician Assistant | Admitting: Physician Assistant

## 2023-12-16 ENCOUNTER — Encounter (HOSPITAL_COMMUNITY): Payer: Self-pay | Admitting: Emergency Medicine

## 2023-12-16 DIAGNOSIS — B379 Candidiasis, unspecified: Secondary | ICD-10-CM

## 2023-12-16 DIAGNOSIS — T3695XA Adverse effect of unspecified systemic antibiotic, initial encounter: Secondary | ICD-10-CM | POA: Insufficient documentation

## 2023-12-16 DIAGNOSIS — B3731 Acute candidiasis of vulva and vagina: Secondary | ICD-10-CM | POA: Diagnosis not present

## 2023-12-16 DIAGNOSIS — R300 Dysuria: Secondary | ICD-10-CM | POA: Insufficient documentation

## 2023-12-16 LAB — POCT URINE DIPSTICK
Bilirubin, UA: NEGATIVE
Blood, UA: NEGATIVE
Glucose, UA: NEGATIVE mg/dL
Ketones, POC UA: NEGATIVE mg/dL
Leukocytes, UA: NEGATIVE
Nitrite, UA: NEGATIVE
POC PROTEIN,UA: NEGATIVE
Spec Grav, UA: >=1.03 — AB (ref 1.010–1.025)
Urobilinogen, UA: 0.2 U/dL
pH, UA: 5 (ref 5.0–8.0)

## 2023-12-16 MED ORDER — FLUCONAZOLE 150 MG PO TABS: 150.0000 mg | ORAL_TABLET | ORAL | 0 refills | Status: AC | PRN

## 2023-12-16 NOTE — ED Triage Notes (Signed)
 Pt reports took antibiotics for her dental appt.  Pt reports having dysuria since last Monday. Reports looked down there and one area was really red. 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 Denies vaginal discharge.

## 2023-12-16 NOTE — Discharge Instructions (Addendum)
 VISIT SUMMARY:  You came in today with symptoms that suggest a yeast infection, which you believe may have been caused by recent antibiotic use. You also mentioned experiencing a burning sensation and dryness, which could be related to menopause. Your history of HPV was noted, but your last test was negative.  YOUR PLAN:  -VULVOVAGINAL CANDIDIASIS: Vulvovaginal candidiasis is a yeast infection that can cause burning and discomfort. It is often triggered by antibiotic use, which can disrupt the natural balance of bacteria and yeast in the body. A cervicovaginal swab was performed to confirm the infection. You have been prescribed Diflucan for internal treatment. We will inform you of the test results if they are positive and require further management or intervention.  INSTRUCTIONS:  We will contact you with the results of your cervicovaginal swab. Please follow the prescribed treatment and reach out if your symptoms do not improve or worsen.

## 2023-12-16 NOTE — ED Provider Notes (Signed)
 [facility]    CSN: [id number] Arrival date & time: 12/16/23  1407      History   Chief Complaint Chief Complaint  Patient presents with   Dysuria    HPI Gwendolyn Gray is a 68 y.o. female.  has a past medical history of Allergic rhinitis, Cystitis, Depression, Genital herpes, HIV (human immunodeficiency virus infection) (HCC), HIV (human immunodeficiency virus infection) (HCC), Hyperlipidemia, and Sleep apnea.   HPI  Discussed the use of AI scribe software for clinical note transcription with the patient, who gave verbal consent to proceed.  The patient presents with symptoms suggestive of a yeast infection following recent antibiotic use.  She recently took 2000 mg of amoxicillin  as prophylaxis before a dental visit, following heart surgery in April.  She is experiencing a burning sensation. She notes that her symptoms might be influenced by menopause, as hormonal changes have led to dryness.   Past Medical History:  Diagnosis Date   Allergic rhinitis    Cystitis    Depression    Genital herpes    HIV (human immunodeficiency virus infection) (HCC)    HIV (human immunodeficiency virus infection) (HCC)    Hyperlipidemia    Sleep apnea     Patient Active Problem List   Diagnosis Date Noted   HIV DISEASE 03/22/2006    Past Surgical History:  Procedure Laterality Date   CARDIAC CATHETERIZATION     COLONOSCOPY WITH PROPOFOL  N/A 12/06/2017   Procedure: COLONOSCOPY WITH PROPOFOL ;  Surgeon: Toledo, Ladell POUR, MD;  Location: [facility];  Service: Gastroenterology;  Laterality: N/A;   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      Home Medications    Prior to Admission medications   Medication Sig Start Date End Date Taking? Authorizing Provider  fluconazole (DIFLUCAN) 150 MG tablet Take 1 tablet (150 mg total) by mouth every three (3) days as needed. May repeat in 3  days if symptoms not resolved 12/16/23  Yes Latravious Levitt E, PA-C  acetaminophen  (TYLENOL ) 500 MG tablet Take 1 tablet (500 mg total) by mouth every 6 (six) hours as needed. 03/10/22   Nivia Colon, PA-C  acyclovir ointment (ZOVIRAX) 5 % Apply 1 application topically every 3 (three) hours. Patient not taking: Reported on 10/07/2023    [provider]  amoxicillin -clavulanate (AUGMENTIN ) 875-125 MG tablet Take 1 tablet by mouth every 12 (twelve) hours. 08/04/23   Francesca Elsie CROME, MD  aspirin 81 MG chewable tablet Chew 81 mg by mouth. 05/24/23 05/23/24  [provider]  DOVATO 50-300 MG tablet Take 1 tablet by mouth daily.    [provider]  etodolac (LODINE) 500 MG tablet Take 500 mg by mouth 2 (two) times daily. 04/27/23   [provider]  ipratropium (ATROVENT) 0.03 % nasal spray 2 sprays 2 (two) times daily as needed. 06/16/23 06/15/24  [provider]  meclizine  (ANTIVERT ) 25 MG tablet Take 1 tablet (25 mg total) by mouth 3 (three) times daily as needed for dizziness. 03/10/22   Nivia Colon, PA-C  methocarbamol  (ROBAXIN ) 500 MG tablet Take 1 tablet (500 mg total) by mouth 2 (two) times daily. 03/16/18   Kehrli, Kelsey F, PA-C  Multiple Vitamin (MULTIVITAMIN) tablet Take 1 tablet by mouth daily.    [provider]  rosuvastatin (CRESTOR) 5 MG tablet Take 5 mg by mouth daily. 01/10/23 01/10/24  [provider]  traMADol (ULTRAM) 50 MG tablet Take 50 mg by mouth every 8 (eight) hours as needed. 06/01/23   [provider]  valACYclovir (VALTREX) 500 MG tablet Take 500 mg by mouth 2 (two) times daily.    [provider]  vitamin B-12 (CYANOCOBALAMIN) 500 MCG tablet Take 500 mcg by mouth daily.    [provider]  Vitamin D, Ergocalciferol, (DRISDOL) 50000 units CAPS capsule Take 50,000 Units by mouth every 7 (seven) days.    [provider]    Family History No family history on file.  Social History Social  History   Tobacco Use   Smoking status: Never   Smokeless tobacco: Never  Vaping Use   Vaping status: Never Used  Substance Use Topics   Alcohol use: No   Drug use: Never     Allergies   Gabapentin   Review of Systems Review of Systems  Genitourinary:  Negative for dysuria, vaginal bleeding, vaginal discharge and vaginal pain.       She reports vulvovaginal burning sensation as well as erythema       Physical Exam Triage Vital Signs ED Triage Vitals  Encounter Vitals Group     BP 12/16/23 1635 (!) 141/83     Girls Systolic BP Percentile --      Girls Diastolic BP Percentile --      Boys Systolic BP Percentile --      Boys Diastolic BP Percentile --      Pulse Rate 12/16/23 1635 73     Resp 12/16/23 1635 18     Temp 12/16/23 1635 98.1 F (36.7 C)     Temp Source 12/16/23 1635 Oral     SpO2 12/16/23 1635 96 %     Weight --      Height --      Head Circumference --      Peak Flow --      Pain Score 12/16/23 1634 0     Pain Loc --      Pain Education --      Exclude from Growth Chart --    No data found.  Updated Vital Signs BP (!) 141/83 (BP Location: Right Arm)   Pulse 73   Temp 98.1 F (36.7 C) (Oral)   Resp 18   SpO2 96%   Visual Acuity Right Eye Distance:   Left Eye Distance:   Bilateral Distance:    Right Eye Near:   Left Eye Near:    Bilateral Near:     Physical Exam Vitals reviewed.  Constitutional:      General: She is awake.     Appearance: Normal appearance. She is well-developed and well-groomed.  HENT:     Head: Normocephalic and atraumatic.  Eyes:     General: Lids are normal. Gaze aligned appropriately.     Extraocular Movements: Extraocular movements intact.     Conjunctiva/sclera: Conjunctivae normal.  Pulmonary:     Effort: Pulmonary effort is normal.  Neurological:     Mental Status: She is alert and oriented to person, place, and time.  Psychiatric:        Attention and Perception: Attention and perception normal.         Mood and Affect: Mood and affect normal.        Speech: Speech normal.        Behavior: Behavior normal. Behavior is cooperative.      UC Treatments / Results  Labs (all labs ordered are listed, but only  abnormal results are displayed) Labs Reviewed  POCT URINE DIPSTICK - Abnormal; Notable for the following components:      Result Value   Spec Grav, UA >=1.030 (*)    All other components within normal limits  CERVICOVAGINAL ANCILLARY ONLY    EKG   Radiology No results found.  Procedures Procedures (including critical care time)  Medications Ordered in UC Medications - No data to display  Initial Impression / Assessment and Plan / UC Course  I have reviewed the triage vital signs and the nursing notes.  Pertinent labs & imaging results that were available during my care of the patient were reviewed by me and considered in my medical decision making (see chart for details).      Final Clinical Impressions(s) / UC Diagnoses   Final diagnoses:  Antibiotic-induced yeast infection  Vulvovaginal candidiasis Suspected vulvovaginal candidiasis following high-dose amoxicillin  use for dental prophylaxis. Symptoms include intermittent burning sensation, likely due to yeast infection. Urinalysis is normal, consistent with yeast infection rather than urinary tract infection. No external symptoms reported, but internal symptoms suggestive of yeast infection. - Performed cervicovaginal swab for confirmation of yeast infection. - Prescribed Diflucan for internal treatment. - Informed her that results will be communicated if positive.     Discharge Instructions      VISIT SUMMARY:  You came in today with symptoms that suggest a yeast infection, which you believe may have been caused by recent antibiotic use. You also mentioned experiencing a burning sensation and dryness, which could be related to menopause. Your history of HPV was noted, but your last test was  negative.  YOUR PLAN:  -VULVOVAGINAL CANDIDIASIS: Vulvovaginal candidiasis is a yeast infection that can cause burning and discomfort. It is often triggered by antibiotic use, which can disrupt the natural balance of bacteria and yeast in the body. A cervicovaginal swab was performed to confirm the infection. You have been prescribed Diflucan for internal treatment. We will inform you of the test results if they are positive and require further management or intervention.  INSTRUCTIONS:  We will contact you with the results of your cervicovaginal swab. Please follow the prescribed treatment and reach out if your symptoms do not improve or worsen.     ED Prescriptions     Medication Sig Dispense Auth. Provider   fluconazole (DIFLUCAN) 150 MG tablet Take 1 tablet (150 mg total) by mouth every three (3) days as needed. May repeat in 3 days if symptoms not resolved 2 tablet Lindy Pennisi E, PA-C      PDMP not reviewed this encounter.   Marylene Rocky BRAVO, PA-C 12/16/23 [id number]

## 2023-12-19 LAB — CERVICOVAGINAL ANCILLARY ONLY
Bacterial Vaginitis (gardnerella): NEGATIVE
Candida Glabrata: NEGATIVE
Candida Vaginitis: NEGATIVE
Chlamydia: NEGATIVE
Comment: NEGATIVE
Comment: NEGATIVE
Comment: NEGATIVE
Comment: NEGATIVE
Comment: NEGATIVE
Comment: NORMAL
Neisseria Gonorrhea: NEGATIVE
Trichomonas: NEGATIVE

## 2024-03-09 ENCOUNTER — Telehealth (HOSPITAL_COMMUNITY): Payer: Self-pay

## 2024-03-09 NOTE — Telephone Encounter (Signed)
 Outside/paper referral received by Dr. Fredirick from [place]. Insurance benefits and eligibility to be determined.   Went over pulmonary rehab program, patient is interested. She previously attended the cardiac rehab program here. Patient will be out of town until 2/16 and can get scheduled after that, she will also be relying on Medicaid transportation.  Will pass to nurse navigator for review.
